# Patient Record
Sex: Female | Born: 1985
Health system: Southern US, Community
[De-identification: ages and names within clinical notes are randomized; demographics above are authoritative.]

## PROBLEM LIST (undated history)

## (undated) DIAGNOSIS — F4321 Adjustment disorder with depressed mood: Secondary | ICD-10-CM

## (undated) DIAGNOSIS — K219 Gastro-esophageal reflux disease without esophagitis: Secondary | ICD-10-CM

## (undated) DIAGNOSIS — E0500 Thyrotoxicosis with diffuse goiter without thyrotoxic crisis or storm: Secondary | ICD-10-CM

## (undated) DIAGNOSIS — S80211A Abrasion, right knee, initial encounter: Secondary | ICD-10-CM

## (undated) DIAGNOSIS — I10 Essential (primary) hypertension: Secondary | ICD-10-CM

## (undated) DIAGNOSIS — E0590 Thyrotoxicosis, unspecified without thyrotoxic crisis or storm: Secondary | ICD-10-CM

## (undated) HISTORY — DX: Gastro-esophageal reflux disease without esophagitis: K21.9

## (undated) HISTORY — DX: Thyrotoxicosis with diffuse goiter without thyrotoxic crisis or storm: E05.00

## (undated) HISTORY — DX: Essential (primary) hypertension: I10

## (undated) HISTORY — DX: Thyrotoxicosis, unspecified without thyrotoxic crisis or storm: E05.90

## (undated) HISTORY — PX: ANTERIOR CRUCIATE LIGAMENT REPAIR: SHX115

## (undated) HISTORY — DX: Adjustment disorder with depressed mood: F43.21

---
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
## 2006-02-22 HISTORY — PX: BREAST SURGERY: SHX581

## 2007-03-18 IMAGING — RF DG FL GUIDE NDL PLMT  - NRPT [facility]
4 series · 4 of 4 positions shown · IV contrast (magnevist)
Comparison: none

CLINICAL DATA: Recurrent knee pain.  History of ACL reconstruction and meniscal repair.  
 FLUOROSCOPICALLY GUIDED NEEDLE PLACEMENT (INJECTION OF THE RIGHT KNEE FOR MR ARTHROGRAPHY):
TECHNIQUE: The procedure was discussed with the patient and her mother.  Informed written consent was obtained.  Lateral patellofemoral joint was localized clinically and fluoroscopically.  1% Lidocaine was administered as local anesthetic after cleansing the skin with Betadine.  A 25 gauge needle was advanced into the patellofemoral joint and approximately 8 cc of a 1% solution of Magnevist mixed with Omnipaque 300 was instilled into the joint.  The needle was removed and spot images were obtained showing contrast throughout the knee joint.

[Series 1: run · 1 of 1 slices shown (1 of 4)]
[im 1/1]
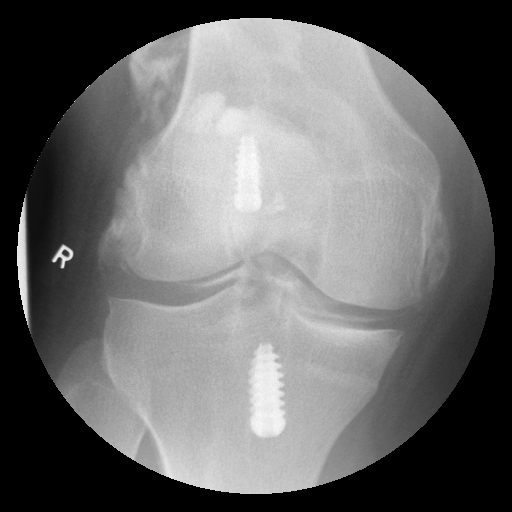

[Series 2: run · 1 of 1 slices shown (2 of 4)]
[im 1/1]
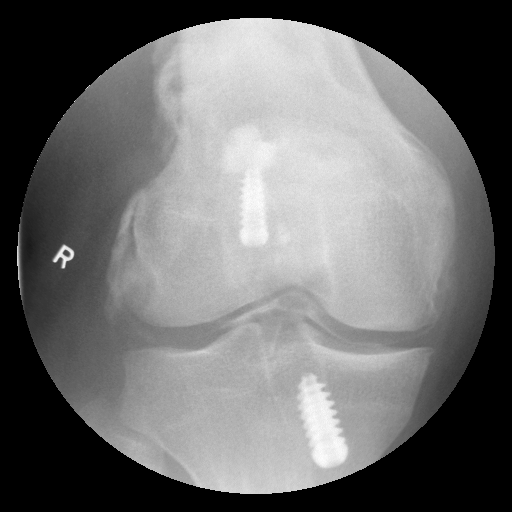

[Series 3: run · 1 of 1 slices shown (3 of 4)]
[im 1/1]
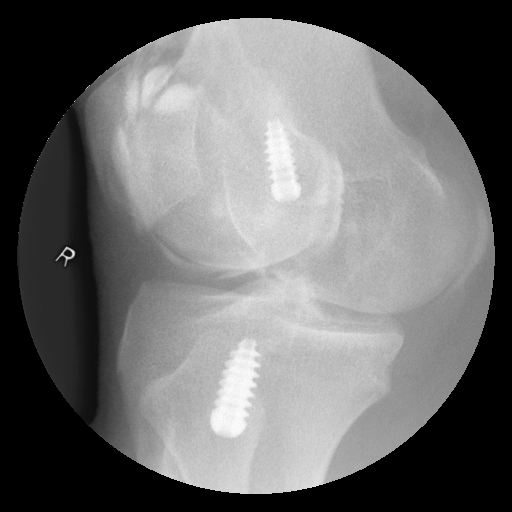

[Series 4: run · 1 of 1 slices shown (4 of 4)]
[im 1/1]
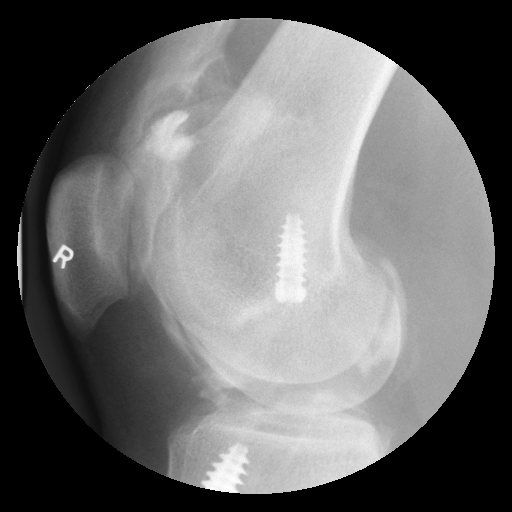

[4 of 4 positions shown; findings below may reference images not displayed]

IMPRESSION: Injection of the right knee for MR arthrography as described.

## 2010-03-15 ENCOUNTER — Encounter: Payer: Self-pay | Admitting: Orthopedic Surgery

## 2016-04-26 ENCOUNTER — Encounter: Payer: Self-pay | Admitting: Family Medicine

## 2016-04-26 ENCOUNTER — Ambulatory Visit (INDEPENDENT_AMBULATORY_CARE_PROVIDER_SITE_OTHER): Payer: Managed Care, Other (non HMO) | Admitting: Family Medicine

## 2016-04-26 VITALS — BP 150/78 | HR 124 | Temp 98.9°F | Resp 16 | Ht 64.0 in | Wt 216.1 lb

## 2016-04-26 DIAGNOSIS — Z7689 Persons encountering health services in other specified circumstances: Secondary | ICD-10-CM | POA: Insufficient documentation

## 2016-04-26 DIAGNOSIS — K219 Gastro-esophageal reflux disease without esophagitis: Secondary | ICD-10-CM

## 2016-04-26 DIAGNOSIS — F4321 Adjustment disorder with depressed mood: Secondary | ICD-10-CM | POA: Diagnosis not present

## 2016-04-26 DIAGNOSIS — Z862 Personal history of diseases of the blood and blood-forming organs and certain disorders involving the immune mechanism: Secondary | ICD-10-CM

## 2016-04-26 DIAGNOSIS — K625 Hemorrhage of anus and rectum: Secondary | ICD-10-CM | POA: Diagnosis not present

## 2016-04-26 HISTORY — DX: Adjustment disorder with depressed mood: F43.21

## 2016-04-26 LAB — TSH: TSH: <0.01 mIU/L — ABNORMAL LOW

## 2016-04-26 LAB — LIPID PANEL
CHOLESTEROL: 109 mg/dL (ref <200)
HDL: 45 mg/dL — ABNORMAL LOW (ref >50)
LDL CALC: 47 mg/dL (ref <100)
Total CHOL/HDL Ratio: 2.4 Ratio (ref <5.0)
Triglycerides: 87 mg/dL (ref <150)
VLDL: 17 mg/dL (ref <30)

## 2016-04-26 LAB — COMPLETE METABOLIC PANEL WITH GFR
ALT: 17 U/L (ref 6–29)
AST: 19 U/L (ref 10–30)
Albumin: 3.8 g/dL (ref 3.6–5.1)
Alkaline Phosphatase: 47 U/L (ref 33–115)
BUN: 7 mg/dL (ref 7–25)
CALCIUM: 9.4 mg/dL (ref 8.6–10.2)
CHLORIDE: 109 mmol/L (ref 98–110)
CO2: 21 mmol/L (ref 20–31)
CREATININE: 0.5 mg/dL (ref 0.50–1.10)
GFR, Est African American: >89 mL/min (ref >=60)
GFR, Est Non African American: >89 mL/min (ref >=60)
Glucose, Bld: 111 mg/dL — ABNORMAL HIGH (ref 65–99)
Potassium: 3.9 mmol/L (ref 3.5–5.3)
Sodium: 141 mmol/L (ref 135–146)
Total Bilirubin: 0.3 mg/dL (ref 0.2–1.2)
Total Protein: 6.5 g/dL (ref 6.1–8.1)

## 2016-04-26 LAB — CBC
HCT: 40 % (ref 35.0–45.0)
Hemoglobin: 13.4 g/dL (ref 11.7–15.5)
MCH: 26.6 pg — ABNORMAL LOW (ref 27.0–33.0)
MCHC: 33.5 g/dL (ref 32.0–36.0)
MCV: 79.4 fL — ABNORMAL LOW (ref 80.0–100.0)
MPV: 10.4 fL (ref 7.5–12.5)
PLATELETS: 270 10*3/uL (ref 140–400)
RBC: 5.04 MIL/uL (ref 3.80–5.10)
RDW: 13.3 % (ref 11.0–15.0)
WBC: 5.9 10*3/uL (ref 3.8–10.8)

## 2016-04-26 MED ORDER — PANTOPRAZOLE SODIUM 40 MG PO TBEC: 40.0000 mg | DELAYED_RELEASE_TABLET | Freq: Every day | ORAL | 3 refills | Status: DC

## 2016-04-26 NOTE — Patient Instructions (Signed)
Need blood testing I will send you a letter with your test results.  If there is anything of concern, we will call right away.  Need to see a GI consultant Referral is placed  Stop the omeprazole Take the pantoprazole instead Take on empty stomach  See me in a month

## 2016-04-26 NOTE — Progress Notes (Signed)
Chief Complaint  Patient presents with  . Establish Care   New to establish Has not had a primary care doctor for years Used to go to pediatrician   She is tearful upset today.  Says it is stress.  Her mother was diagnosed with a progressive auto immune disease Neuromyelitis Optica in August, then found to have breast cancer in November.  She lives at home with parents.  She changed jobs recently.  She has had GI symptoms of abdominal fullness, pain, heartburn, blood in BM and blood in emesis ( each one time)  over the last few months.  May be stress related but paternal grandmother had colon cancer at the age of 30.  She went to the health dept and was given omeprazole.  Not completely helping.   PAP up to date On OCP  No flu shot - advised  Patient Active Problem List   Diagnosis Date Noted  . GERD (gastroesophageal reflux disease) 04/26/2016  . BRBPR (bright red blood per rectum) 04/26/2016  . Encounter to establish care with new doctor 04/26/2016  . Situational depression / anxiety 04/26/2016  . History of iron deficiency anemia 04/26/2016    Outpatient Encounter Prescriptions as of 04/26/2016  Medication Sig  . norethindrone-ethinyl estradiol (MICROGESTIN,JUNEL,LOESTRIN) 1-20 MG-MCG tablet Take 1 tablet by mouth daily.  . [DISCONTINUED] omeprazole (PRILOSEC) 40 MG capsule Take 40 mg by mouth daily.  . pantoprazole (PROTONIX) 40 MG tablet Take 1 tablet (40 mg total) by mouth daily.   No facility-administered encounter medications on file as of 04/26/2016.     Past Medical History:  Diagnosis Date  . GERD (gastroesophageal reflux disease)   . Hypertension   . Situational depression / anxiety 04/26/2016    Past Surgical History:  Procedure Laterality Date  . ANTERIOR CRUCIATE LIGAMENT REPAIR  [id number],2009  . BREAST SURGERY  2008   reconstruction    Social History   Social History  . Marital status: Single    Spouse name: N/A  . Number of children: 0  . Years of  education: 6416   Occupational History  . admin    Social History Main Topics  . Smoking status: Never Smoker  . Smokeless tobacco: Never Used  . Alcohol use No  . Drug use: No  . Sexual activity: Not Currently   Other Topics Concern  . Not on file   Social History Narrative   Graduated [employer] with degree political science      Works as admin asst      Lives with parents      2 dogs          Family History  Problem Relation Age of Onset  . Cancer Mother     breast  . Autoimmune disease Mother     devin  . Cancer Father     skin  . Diabetes Father   . Hypertension Father   . Hyperlipidemia Maternal Grandmother   . Diabetes Maternal Grandfather   . Hyperlipidemia Maternal Grandfather   . Cancer Paternal Grandmother     colon cancer at 7130 and 3060  . ALS Paternal Grandfather [id number]    Review of Systems  Constitutional: Positive for weight loss. Negative for chills, fever and malaise/fatigue.  HENT: Negative for congestion and hearing loss.   Eyes: Negative for blurred vision and double vision.  Respiratory: Negative for cough.   Cardiovascular: Negative for chest pain, palpitations and leg swelling.  Gastrointestinal: Positive for abdominal pain, diarrhea and heartburn.  Genitourinary: Negative for dysuria and hematuria.  Musculoskeletal: Negative for myalgias.  Neurological: Negative for dizziness and headaches.  Psychiatric/Behavioral: Positive for depression. The patient is nervous/anxious and has insomnia.    BP (!) 150/78 (BP Location: Right Arm, Patient Position: Sitting, Cuff Size: Normal)   Pulse (!) 124   Temp 98.9 F (37.2 C) (Temporal)   Resp 16   Ht 5\' 4"  (1.626 m)   Wt 216 lb 1.3 oz (98 kg)   LMP 04/25/2016 (Exact Date)   SpO2 97%   BMI 37.09 kg/m   Physical Exam  Constitutional: She is oriented to person, place, and time. She appears well-developed and well-nourished.  overweight  HENT:  Head: Normocephalic and atraumatic.  Right Ear: External  ear normal.  Left Ear: External ear normal.  Mouth/Throat: Oropharynx is clear and moist.  Eyes: Conjunctivae are normal. Pupils are equal, round, and reactive to light.  Neck: Normal range of motion. Neck supple. No thyromegaly present.  Cardiovascular: Normal rate, regular rhythm and normal heart sounds.   Pulmonary/Chest: Effort normal and breath sounds normal. No respiratory distress.  Abdominal: Soft. Bowel sounds are normal.  No HSM, no mass  Musculoskeletal: Normal range of motion. She exhibits no edema.  Lymphadenopathy:    She has no cervical adenopathy.  Neurological: She is alert and oriented to person, place, and time.  Gait normal  Skin: Skin is warm and dry.  Psychiatric: Her behavior is normal. Thought content normal.  tearful  Nursing note and vitals reviewed.   1. BRBPR (bright red blood per rectum)   2. Gastroesophageal reflux disease, esophagitis presence not specified - Ambulatory referral to Gastroenterology   3. Situational depression / anxiety  - COMPLETE METABOLIC PANEL WITH GFR - Lipid panel - TSH - Urinalysis, Routine w reflex microscopic - CBC  4. History of iron deficiency anemia    Patient Instructions  Need blood testing I will send you a letter with your test results.  If there is anything of concern, we will call right away.  Need to see a GI consultant Referral is placed  Stop the omeprazole Take the pantoprazole instead Take on empty stomach  See me in a month    Eustace Moore, MD

## 2016-04-27 ENCOUNTER — Encounter: Payer: Self-pay | Admitting: Family Medicine

## 2016-04-27 DIAGNOSIS — R7309 Other abnormal glucose: Secondary | ICD-10-CM

## 2016-04-27 DIAGNOSIS — R7989 Other specified abnormal findings of blood chemistry: Secondary | ICD-10-CM

## 2016-04-27 LAB — URINALYSIS, ROUTINE W REFLEX MICROSCOPIC
Bilirubin Urine: NEGATIVE
Glucose, UA: NEGATIVE
KETONES UR: NEGATIVE
Leukocytes, UA: NEGATIVE
NITRITE: NEGATIVE
PROTEIN: NEGATIVE
Specific Gravity, Urine: 1.031 (ref 1.001–1.035)
pH: 5.5 (ref 5.0–8.0)

## 2016-04-27 LAB — URINALYSIS, MICROSCOPIC ONLY
BACTERIA UA: NONE SEEN [HPF]
Casts: NONE SEEN [LPF]
YEAST: NONE SEEN [HPF]

## 2016-04-29 ENCOUNTER — Encounter: Payer: Self-pay | Admitting: Internal Medicine

## 2016-05-20 ENCOUNTER — Ambulatory Visit (INDEPENDENT_AMBULATORY_CARE_PROVIDER_SITE_OTHER): Payer: Managed Care, Other (non HMO) | Admitting: Nurse Practitioner

## 2016-05-20 ENCOUNTER — Other Ambulatory Visit: Payer: Self-pay

## 2016-05-20 ENCOUNTER — Encounter: Payer: Self-pay | Admitting: Nurse Practitioner

## 2016-05-20 VITALS — BP 155/85 | HR 135 | Temp 97.9°F | Ht 66.0 in | Wt 211.4 lb

## 2016-05-20 DIAGNOSIS — R634 Abnormal weight loss: Secondary | ICD-10-CM

## 2016-05-20 DIAGNOSIS — K219 Gastro-esophageal reflux disease without esophagitis: Secondary | ICD-10-CM

## 2016-05-20 DIAGNOSIS — R194 Change in bowel habit: Secondary | ICD-10-CM

## 2016-05-20 DIAGNOSIS — K625 Hemorrhage of anus and rectum: Secondary | ICD-10-CM | POA: Diagnosis not present

## 2016-05-20 MED ORDER — PEG-KCL-NACL-NASULF-NA ASC-C 100 G PO SOLR: 1.0000 | ORAL | 0 refills | Status: DC

## 2016-05-20 NOTE — Patient Instructions (Signed)
1. We will schedule your procedure for you. 2. Bring her stool studies to the lab, not our office, and we will call you with the results when they are available. 3. Keep taking Protonix. 4. Return for follow-up in 2 months. Call us if you have any severe or worsening symptoms before then.

## 2016-05-20 NOTE — Progress Notes (Addendum)
REVIEWED-NO ADDITIONAL RECOMMENDATIONS.  Primary Care Physician:  Eustace MooreYvonne Sue Nelson, MD Primary Gastroenterologist:  Dr. Darrick PennaFields  Chief Complaint  Patient presents with  . Rectal Bleeding    bright red x 1  . Gastroesophageal Reflux  . Emesis    had blood in vomit x 1  . Weight Loss    lost 5 lbs since 04/26/16, no change in eating    HPI:   Kelly [name] is a 31 y.o. female who presents for rectal bleeding and reflux. The patient was last seen by primary care 04/26/2016 and at that point was under significant stress due to her mother recently being diagnosed with progressive autoimmune disease and breast cancer. She complained of abdominal fullness, pain, heartburn, blood in her bowel movements and emesis over the last few months. Paternal grandmother had colon cancer at age of 30. Had started omeprazole but not helping.  Today she states she's doing pretty good. Stress has not decreased at all. Protonix is helping her GERD symptoms. Is having more breakout (acne) since starting it although ADE profile doesn't include this for Protonix. No breakthrough heartburn on Protonix. Has hematochezia in January, bright red, on the stool. No hemorrhoid symptoms. Not constipated at the time. Denies melena, other abdominal pain (other than her reflux symptoms). Had an episode of vomiting about 2 weeks after hematochezia with noted red specks (has a picture, 2-3 red specks noted). No further vomiting since then. Is also having frequent diarrhea 1-2 times a day. States she has always had some diarrhea, typically around her cycle, but now has been more persistent, constant, and ongoing. This change occurred in November 2017 when her mom was diagnosed with above conditions (significant increase in stress.) Has also had about 5-10 lb weight loss in the past 2 months ([facility]). No significant change in appetite. Denies chest pain, dyspnea, dizziness, lightheadedness,  syncope, near syncope. Denies any other upper or lower GI symptoms.  Past Medical History:  Diagnosis Date  . GERD (gastroesophageal reflux disease)   . Hypertension   . Situational depression / anxiety 04/26/2016    Past Surgical History:  Procedure Laterality Date  . ANTERIOR CRUCIATE LIGAMENT REPAIR  [id number],2009  . BREAST SURGERY  2008   reconstruction    Current Outpatient Prescriptions  Medication Sig Dispense Refill  . norethindrone-ethinyl estradiol (MICROGESTIN,JUNEL,LOESTRIN) 1-20 MG-MCG tablet Take 1 tablet by mouth daily.    . pantoprazole (PROTONIX) 40 MG tablet Take 1 tablet (40 mg total) by mouth daily. 30 tablet 3   No current facility-administered medications for this visit.     Allergies as of 05/20/2016  . (No Known Allergies)    Family History  Problem Relation Age of Onset  . Cancer Mother     breast  . Autoimmune disease Mother     devin  . Cancer Father     skin  . Diabetes Father   . Hypertension Father   . Hyperlipidemia Maternal Grandmother   . Diabetes Maternal Grandfather   . Hyperlipidemia Maternal Grandfather   . Colon cancer Paternal Grandmother     colon cancer at 230 and 1260  . ALS Paternal Grandfather [id number]    Social History   Social History  . Marital status: Single    Spouse name: N/A  . Number of children: 0  . Years of education: 3516   Occupational History  . admin    Social History Main Topics  . Smoking status: Never Smoker  .  Smokeless tobacco: Never Used  . Alcohol use No  . Drug use: No  . Sexual activity: Not Currently   Other Topics Concern  . Not on file   Social History Narrative   Graduated [employer] with degree political science      Works as admin asst      Lives with parents      2 dogs          Review of Systems: Complete ROS negative except as per HPI.   Physical Exam: BP (!) 155/85   Pulse (!) 135   Temp 97.9 F (36.6 C) (Oral)   Ht 5\' 6"  (1.676 m)   Wt 211 lb 6.4 oz (95.9 kg)   LMP  04/25/2016 (Exact Date)   BMI 34.12 kg/m  General:   Obese female. Alert and oriented. Pleasant and cooperative. Well-nourished and well-developed.  Head:  Normocephalic and atraumatic. Eyes:  Without icterus, sclera clear and conjunctiva pink.  Ears:  Normal auditory acuity. Cardiovascular:  S1, S2 present without murmurs appreciated. Extremities without clubbing or edema. Respiratory:  Clear to auscultation bilaterally. No wheezes, rales, or rhonchi. No distress.  Gastrointestinal:  +BS, soft, non-tender and non-distended. No HSM noted. No guarding or rebound. No masses appreciated.  Rectal:  Deferred  Musculoskalatal:  Symmetrical without gross deformities. Neurologic:  Alert and oriented x4;  grossly normal neurologically. Psych:  Alert and cooperative. Normal mood and affect. Heme/Lymph/Immune: No excessive bruising noted.    05/20/2016 10:39 AM   Disclaimer: This note was dictated with voice recognition software. Similar sounding words can inadvertently be transcribed and may not be corrected upon review.

## 2016-05-20 NOTE — Assessment & Plan Note (Signed)
The patient is indicated about a 5-10 pound weight loss over the past 2 months. This is unintentional and without significant change in appetite. Given this as well as her episode of rectal bleeding and family history of colon cancer and a secondary relative (paternal grandmother) we will proceed with a colonoscopy as per below. Return for follow-up in 2 months.

## 2016-05-20 NOTE — Assessment & Plan Note (Signed)
The patient has had a single episode of bright red blood on the stool but she is noted. She does not have hemorrhoids or hemorrhoid symptoms. Additionally, she has had a change in stool habits and weight loss as per above. Her paternal grandmother had colon cancer at age 30. Her father has not had a colonoscopy yet. Given her constellation of symptoms of feel it is warranted to proceed with a diagnostic colonoscopy at this time. Return for follow-up in 2 months.  Proceed with colonoscopy with Dr. Darrick PennaFields in the near future. The risks, benefits, and alternatives have been discussed in detail with the patient. They state understanding and desire to proceed.   The patient is not on any anticoagulants, anxiolytics, chronic pain medications, or antidepressants. Conscious sedation should be adequate for her procedure.

## 2016-05-20 NOTE — Assessment & Plan Note (Signed)
The patient notes a change in her stool habits. She has always had diarrhea but generally this is only been around her menstrual cycle. Within the past couple few months she has switched over to persistent diarrhea. This is in addition to weight loss and rectal bleeding. Her paternal grandmother had colon cancer at age 30. Her father has not had a colonoscopy as of yet. We'll plan for colonoscopy as per below. I will also check stool studies to make sure she does not have a colonic infection. Return for follow-up in 2 months.

## 2016-05-20 NOTE — Assessment & Plan Note (Signed)
Well controlled on protonix. Continue PPI, return for follow-up in 2 months.

## 2016-05-24 NOTE — Progress Notes (Signed)
CC'D TO PCP °

## 2016-05-25 ENCOUNTER — Ambulatory Visit: Payer: Managed Care, Other (non HMO) | Admitting: Family Medicine

## 2016-05-26 ENCOUNTER — Other Ambulatory Visit: Payer: Self-pay | Admitting: Nurse Practitioner

## 2016-05-27 ENCOUNTER — Ambulatory Visit: Payer: Managed Care, Other (non HMO) | Admitting: Family Medicine

## 2016-05-27 LAB — CLOSTRIDIUM DIFFICILE BY PCR: Toxigenic C. Difficile by PCR: NOT DETECTED

## 2016-05-27 NOTE — Progress Notes (Signed)
PT is aware.

## 2016-05-31 LAB — GASTROINTESTINAL PATHOGEN PANEL PCR

## 2016-06-01 ENCOUNTER — Encounter: Payer: Self-pay | Admitting: Family Medicine

## 2016-06-01 ENCOUNTER — Ambulatory Visit (INDEPENDENT_AMBULATORY_CARE_PROVIDER_SITE_OTHER): Payer: Managed Care, Other (non HMO) | Admitting: Family Medicine

## 2016-06-01 VITALS — BP 130/72 | HR 104 | Temp 98.5°F | Resp 18 | Ht 66.0 in | Wt 210.0 lb

## 2016-06-01 DIAGNOSIS — R7989 Other specified abnormal findings of blood chemistry: Secondary | ICD-10-CM

## 2016-06-01 DIAGNOSIS — R000 Tachycardia, unspecified: Secondary | ICD-10-CM | POA: Diagnosis not present

## 2016-06-01 DIAGNOSIS — R634 Abnormal weight loss: Secondary | ICD-10-CM | POA: Diagnosis not present

## 2016-06-01 DIAGNOSIS — R946 Abnormal results of thyroid function studies: Secondary | ICD-10-CM

## 2016-06-01 DIAGNOSIS — R194 Change in bowel habit: Secondary | ICD-10-CM

## 2016-06-01 NOTE — Progress Notes (Signed)
Chief Complaint  Patient presents with  . Follow-up    1 month   Patient is here for routine follow-up. She has been seen by gastroenterology and has a colonoscopy scheduled. Her stool studies so far have not revealed a pathogen causing diarrhea. Her GERD symptoms are reasonably well controlled on the pantoprazole once a day. She continues to slowly lose weight. She has not had any more blood in her bowels. I did review the blood work that she had at her last visit. Her TSH is suppressed. We discussed that she possibly has hyperthyroidism causing the majority of her symptoms including diarrhea, weight loss, tachycardia, and anxiety. I am repeating her thyroid studies and thyroid antibodies today. She is otherwise well with no complaints.  Patient Active Problem List   Diagnosis Date Noted  . Change in stool habits 05/20/2016  . Loss of weight 05/20/2016  . GERD (gastroesophageal reflux disease) 04/26/2016  . BRBPR (bright red blood per rectum) 04/26/2016  . Encounter to establish care with new doctor 04/26/2016  . Situational depression / anxiety 04/26/2016  . History of iron deficiency anemia 04/26/2016    Outpatient Encounter Prescriptions as of 06/01/2016  Medication Sig  . Multiple Vitamin (MULTIVITAMIN WITH MINERALS) TABS tablet Take 1 tablet by mouth once a week.  . naproxen sodium (ANAPROX) 220 MG tablet Take 440 mg by mouth daily as needed (pain).  . norethindrone-ethinyl estradiol (MICROGESTIN,JUNEL,LOESTRIN) 1-20 MG-MCG tablet Take 1 tablet by mouth daily.  . pantoprazole (PROTONIX) 40 MG tablet Take 1 tablet (40 mg total) by mouth daily.  . peg 3350 powder (MOVIPREP) 100 g SOLR Take 1 kit (200 g total) by mouth as directed.   No facility-administered encounter medications on file as of 06/01/2016.     No Known Allergies  Review of Systems  Constitutional: Positive for unexpected weight change. Negative for activity change and appetite change.  HENT: Negative for  congestion, dental problem, postnasal drip and rhinorrhea.   Eyes: Negative for redness and visual disturbance.  Respiratory: Negative for cough and shortness of breath.   Cardiovascular: Negative for chest pain, palpitations and leg swelling.       Rapid heart rate  Gastrointestinal: Positive for diarrhea. Negative for abdominal pain and constipation.  Genitourinary: Negative for difficulty urinating, frequency and menstrual problem.  Musculoskeletal: Negative for arthralgias and back pain.  Neurological: Negative for dizziness and headaches.  Psychiatric/Behavioral: Negative for dysphoric mood and sleep disturbance. The patient is nervous/anxious.     BP 130/72 (BP Location: Right Arm, Patient Position: Sitting, Cuff Size: Normal)   Pulse (!) 104   Temp 98.5 F (36.9 C) (Temporal)   Resp 18   Ht '5\' 6"'$  (1.676 m)   Wt 210 lb (95.3 kg)   LMP 05/25/2016 (Exact Date)   SpO2 99%   BMI 33.89 kg/m   Physical Exam  Constitutional: She is oriented to person, place, and time. She appears well-developed and well-nourished. No distress.  Normal mood and affect  HENT:  Head: Normocephalic and atraumatic.  Mouth/Throat: Oropharynx is clear and moist.  Eyes: Conjunctivae are normal. Pupils are equal, round, and reactive to light.  Neck: Normal range of motion. Neck supple. Thyromegaly present.  Slight diffuse thyromegaly  Cardiovascular: Regular rhythm and normal heart sounds.   Rate 116  Pulmonary/Chest: Effort normal and breath sounds normal.  Neurological: She is alert and oriented to person, place, and time. She displays normal reflexes.  Reflexes are normal  Skin:  Skin and hair are  normal  Psychiatric: She has a normal mood and affect. Her behavior is normal.    ASSESSMENT/PLAN:  1. Abnormal thyroid stimulating hormone (TSH) level Suspect autoimmune thyroid disease/Graves' disease - TSH - T3 - T4 - Thyroid peroxidase antibody  2. Loss of weight Suspect  hyperthyroidism  3. Change in stool habits As above  4. Tachycardia As above   Patient Instructions  Reduce the caffeine I have ordered additional lab tests I will notify you test results.  If there is anything of concern, we will call right away. May need referral to endocrinology  Call for problems - see as needed follow up    Raylene Everts, MD

## 2016-06-01 NOTE — Patient Instructions (Signed)
Reduce the caffeine I have ordered additional lab tests I will notify you test results.  If there is anything of concern, we will call right away. May need referral to endocrinology  Call for problems - see as needed follow up

## 2016-06-02 ENCOUNTER — Encounter: Payer: Self-pay | Admitting: Family Medicine

## 2016-06-02 DIAGNOSIS — E0590 Thyrotoxicosis, unspecified without thyrotoxic crisis or storm: Secondary | ICD-10-CM

## 2016-06-02 LAB — HEMOGLOBIN A1C
HEMOGLOBIN A1C: 4.9 % (ref <5.7)
MEAN PLASMA GLUCOSE: 94 mg/dL

## 2016-06-02 LAB — T4: T4 TOTAL: 30.3 ug/dL — AB (ref 4.5–12.0)

## 2016-06-02 LAB — T3: T3 TOTAL: 591 ng/dL — AB (ref 76–181)

## 2016-06-02 LAB — THYROID PEROXIDASE ANTIBODY: Thyroperoxidase Ab SerPl-aCnc: 125 IU/mL — ABNORMAL HIGH (ref <9)

## 2016-06-02 LAB — TSH: TSH: 0.01 mIU/L — ABNORMAL LOW

## 2016-06-04 ENCOUNTER — Ambulatory Visit (HOSPITAL_COMMUNITY)
Admission: RE | Admit: 2016-06-04 | Discharge: 2016-06-04 | Disposition: A | Discharge status: Home / Self Care | Payer: Managed Care, Other (non HMO) | Source: Ambulatory Visit | Attending: Gastroenterology | Admitting: Gastroenterology

## 2016-06-04 ENCOUNTER — Encounter (HOSPITAL_COMMUNITY): Payer: Self-pay | Admitting: *Deleted

## 2016-06-04 ENCOUNTER — Encounter (HOSPITAL_COMMUNITY)
Admission: RE | Disposition: A | Discharge status: Home / Self Care | Payer: Self-pay | Source: Ambulatory Visit | Attending: Gastroenterology

## 2016-06-04 DIAGNOSIS — Z832 Family history of diseases of the blood and blood-forming organs and certain disorders involving the immune mechanism: Secondary | ICD-10-CM | POA: Diagnosis not present

## 2016-06-04 DIAGNOSIS — Q438 Other specified congenital malformations of intestine: Secondary | ICD-10-CM | POA: Insufficient documentation

## 2016-06-04 DIAGNOSIS — Z79899 Other long term (current) drug therapy: Secondary | ICD-10-CM | POA: Diagnosis not present

## 2016-06-04 DIAGNOSIS — Z808 Family history of malignant neoplasm of other organs or systems: Secondary | ICD-10-CM | POA: Diagnosis not present

## 2016-06-04 DIAGNOSIS — Z803 Family history of malignant neoplasm of breast: Secondary | ICD-10-CM | POA: Diagnosis not present

## 2016-06-04 DIAGNOSIS — R1030 Lower abdominal pain, unspecified: Secondary | ICD-10-CM | POA: Diagnosis not present

## 2016-06-04 DIAGNOSIS — F419 Anxiety disorder, unspecified: Secondary | ICD-10-CM | POA: Insufficient documentation

## 2016-06-04 DIAGNOSIS — R634 Abnormal weight loss: Secondary | ICD-10-CM

## 2016-06-04 DIAGNOSIS — X58XXXA Exposure to other specified factors, initial encounter: Secondary | ICD-10-CM | POA: Diagnosis not present

## 2016-06-04 DIAGNOSIS — Z820 Family history of epilepsy and other diseases of the nervous system: Secondary | ICD-10-CM | POA: Insufficient documentation

## 2016-06-04 DIAGNOSIS — I10 Essential (primary) hypertension: Secondary | ICD-10-CM | POA: Diagnosis not present

## 2016-06-04 DIAGNOSIS — Z8249 Family history of ischemic heart disease and other diseases of the circulatory system: Secondary | ICD-10-CM | POA: Insufficient documentation

## 2016-06-04 DIAGNOSIS — K648 Other hemorrhoids: Secondary | ICD-10-CM | POA: Insufficient documentation

## 2016-06-04 DIAGNOSIS — S80211A Abrasion, right knee, initial encounter: Secondary | ICD-10-CM | POA: Diagnosis not present

## 2016-06-04 DIAGNOSIS — K921 Melena: Secondary | ICD-10-CM | POA: Insufficient documentation

## 2016-06-04 DIAGNOSIS — K219 Gastro-esophageal reflux disease without esophagitis: Secondary | ICD-10-CM | POA: Insufficient documentation

## 2016-06-04 DIAGNOSIS — R194 Change in bowel habit: Secondary | ICD-10-CM | POA: Diagnosis not present

## 2016-06-04 DIAGNOSIS — Z800 Family history of malignant neoplasm of digestive organs: Secondary | ICD-10-CM | POA: Diagnosis not present

## 2016-06-04 DIAGNOSIS — Z833 Family history of diabetes mellitus: Secondary | ICD-10-CM | POA: Insufficient documentation

## 2016-06-04 HISTORY — PX: COLONOSCOPY: SHX5424

## 2016-06-04 HISTORY — DX: Abrasion, right knee, initial encounter: S80.211A

## 2016-06-04 SURGERY — COLONOSCOPY
Anesthesia: Moderate Sedation

## 2016-06-04 MED ORDER — PROMETHAZINE HCL 25 MG/ML IJ SOLN
12.5000 mg | Freq: Once | INTRAMUSCULAR | Status: AC
  Administered 2016-06-04: 12.5 mg via INTRAVENOUS

## 2016-06-04 MED ORDER — MEPERIDINE HCL 100 MG/ML IJ SOLN
INTRAMUSCULAR | Status: AC
  Filled 2016-06-04: qty 2

## 2016-06-04 MED ORDER — SODIUM CHLORIDE 0.9% FLUSH
INTRAVENOUS | Status: AC
  Filled 2016-06-04: qty 10

## 2016-06-04 MED ORDER — STERILE WATER FOR IRRIGATION IR SOLN
Status: DC | PRN
  Administered 2016-06-04: 2.5 mL

## 2016-06-04 MED ORDER — SODIUM CHLORIDE 0.9 % IV SOLN
INTRAVENOUS | Status: DC
  Administered 2016-06-04: 12:00:00 via INTRAVENOUS

## 2016-06-04 MED ORDER — MEPERIDINE HCL 100 MG/ML IJ SOLN
INTRAMUSCULAR | Status: DC | PRN
  Administered 2016-06-04: 50 mg via INTRAVENOUS
  Administered 2016-06-04 (×2): 25 mg via INTRAVENOUS

## 2016-06-04 MED ORDER — PROMETHAZINE HCL 25 MG/ML IJ SOLN
INTRAMUSCULAR | Status: AC
  Filled 2016-06-04: qty 1

## 2016-06-04 MED ORDER — MIDAZOLAM HCL 5 MG/5ML IJ SOLN
INTRAMUSCULAR | Status: AC
  Filled 2016-06-04: qty 10

## 2016-06-04 MED ORDER — MIDAZOLAM HCL 5 MG/5ML IJ SOLN
INTRAMUSCULAR | Status: DC | PRN
Start: 2016-06-04 — End: 2016-06-04
  Administered 2016-06-04 (×3): 2 mg via INTRAVENOUS
  Administered 2016-06-04: 1 mg via INTRAVENOUS
  Administered 2016-06-04: 2 mg via INTRAVENOUS

## 2016-06-04 MED ORDER — ELUXADOLINE 75 MG PO TABS: 75.0000 mg | ORAL_TABLET | Freq: Two times a day (BID) | ORAL | 5 refills | Status: DC

## 2016-06-04 NOTE — Op Note (Signed)
[facility] Patient Name: Kelly [name] Procedure Date: 06/04/2016 12:59 PM MRN: [id number] Date of Birth: [birth year] Attending MD: Jonette Eva , MD CSN: [id number] Age: 31 Admit Type: Outpatient Procedure:                Colonoscopy WITH COLD FORCEPS BIOPSY Indications:              Lower abdominal pain, Hematochezia-HAS CHANGE IN                            HER STOOL BEFORE AND AFTER HER MENSTRUAL CYCLE.                            OTHERWISE SHE USUALLY HAS NORMAL STOOL. Providers:                Jonette Eva, MD, Nena Polio, RN, Toniann Fail                            RN, RN Referring MD:             Lily Peer Medicines:                Midazolam 9 mg IV, Meperidine 100 mg IV,                            Promethazine 12.5 mg IV Complications:            No immediate complications. Estimated Blood Loss:     Estimated blood loss was minimal. Procedure:                Pre-Anesthesia Assessment:                           - Prior to the procedure, a History and Physical                            was performed, and patient medications and                            allergies were reviewed. The patient's tolerance of                            previous anesthesia was also reviewed. The risks                            and benefits of the procedure and the sedation                            options and risks were discussed with the patient.                            All questions were answered, and informed consent                            was obtained. Prior Anticoagulants: The patient has  taken naproxen, last dose was 1 day prior to                            procedure. ASA Grade Assessment: II - A patient                            with mild systemic disease. After reviewing the                            risks and benefits, the patient was deemed in                            satisfactory condition to undergo the procedure.       After obtaining informed consent, the colonoscope                            was passed under direct vision. Throughout the                            procedure, the patient's blood pressure, pulse, and                            oxygen saturations were monitored continuously. The                            EC-[id number] ([id number]) scope was introduced through                            the anus and advanced to the 10 cm into the ileum.                            The colonoscopy was technically difficult and                            complex due to significant looping. Successful                            completion of the procedure was aided by increasing                            the dose of sedation medication, straightening and                            shortening the scope to obtain bowel loop reduction                            and COLOWRAP. The patient tolerated the procedure                            fairly well. SHE WAS EXTREMELY AGITATED AND TEARFUL                            WHEN WITHDRAWING THE SCOPE.  SHE WAS RELIEVED BY                            RELEASING THE COLOWRAP. AT THE END OF THE PROCEDURE                            SHE WAS ASLEEP. The quality of the bowel                            preparation was excellent. The terminal ileum,                            ileocecal valve, appendiceal orifice, and rectum                            were photographed. Scope In: 1:51:54 PM Scope Out: 2:02:06 PM Scope Withdrawal Time: 0 hours 8 minutes 10 seconds  Total Procedure Duration: 0 hours 10 minutes 12 seconds  Findings:      The terminal ileum appeared normal.      The recto-sigmoid colon, sigmoid colon and descending colon were       moderately redundant. Biopsies for histology were taken with a cold       forceps from the cecum, ascending colon and transverse colon for       evaluation of microscopic colitis.      The exam was otherwise without abnormality.      Internal  hemorrhoids were found during retroflexion. The hemorrhoids       were small. Impression:               - ABDOMINAL PAIN AND LOOSE STOOL MOST LIKELY DUE TO                            IBS-D, LESS LIKELY MICROSCOPIC COLITIS                           - Redundant LEFT colon.                           - RECTAL BLEEDING DUE TO Internal hemorrhoids. Moderate Sedation:      Moderate (conscious) sedation was administered by the endoscopy nurse       and supervised by the endoscopist. The following parameters were       monitored: oxygen saturation, heart rate, blood pressure, and response       to care. Total physician intraservice time was 37 minutes. Recommendation:           - High fiber/LACTOSE FREE diet.                           - Continue present medications. ADD VIBERZI WITH                            FLARES.                           - Await pathology results.                           -  Repeat colonoscopy AGE 89 FOR SURVEILLANCE.                           - Return to GI office in 4 months.                           - Patient has a contact number available for                            emergencies. The signs and symptoms of potential                            delayed complications were discussed with the                            patient. Return to normal activities tomorrow.                            Written discharge instructions were provided to the                            patient. Procedure Code(s):        --- Professional ---                           [phone], Colonoscopy, flexible; with biopsy, single                            or multiple                           99152, Moderate sedation services provided by the                            same physician or other qualified health care                            professional performing the diagnostic or                            therapeutic service that the sedation supports,                            requiring the presence  of an independent trained                            observer to assist in the monitoring of the                            patient's level of consciousness and physiological                            status; initial 15 minutes of intraservice time,  patient age 33 years or older                           [phone], Moderate sedation services; each additional                            15 minutes intraservice time Diagnosis Code(s):        --- Professional ---                           K64.8, Other hemorrhoids                           R10.30, Lower abdominal pain, unspecified                           K92.1, Melena (includes Hematochezia)                           Q43.8, Other specified congenital malformations of                            intestine CPT copyright 2016 [employer]. All rights reserved. The codes documented in this report are preliminary and upon coder review may  be revised to meet current compliance requirements. Jonette Eva, MD Jonette Eva, MD 06/04/2016 2:34:28 PM This report has been signed electronically. Number of Addenda: 0

## 2016-06-04 NOTE — Discharge Instructions (Signed)
NO SOURCE FOR YOUR ABDOMINAL PAIN/DIARRHEA WAS IDENTIFIED. IT IS MOST LIKELY DUE TO IBS and less likely thyroid disease. You have internal hemorrhoids, WHICH IS THE CAUSE FOR YOUR RECTAL BLEEDING. YOU DID NOT HAVE ANY POLYPS. THE LAST PART OF YOUR SMALL BOWEL IS NORMAL. I BIOPSIED YOUR COLON.   TO REDUCE ABDOMINAL PAIN AND WATERY STOOL,  TRY VIBERZI. TAKE ONE TABLET DAILY OR EVERY OTHER DAY  WHEN YOU ARE HAVING FLARES. HOLD FOR IF YOU DON'T HAVE A BM IN A 24 HR PERIOD OF TIME. USE ONLY IMODIUM 1 OR 2 TIMES A DAY IF NEEDED TO CONTROL LOOSE STOOLS. YOU MUST CALL IF YOU GO MORE THAN 4 DAYS WITHOUT A BOWEL MOVEMENT OR YOU DEVELOP INCREASE IN NAUSEA, VOMITING, OR ABDOMINAL PAIN.  DO NOT CONSUME MORE THAN 3 ALCOHOLIC DRINKS A DAY WHILE TAKING VIBERZI.   DRINK WATER TO KEEP YOUR URINE LIGHT YELLOW.  FOLLOW A HIGH FIBER/LACTOSE FREE DIET. AVOID ITEMS THAT CAUSE BLOATING. SEE INFO BELOW.  USE PREPARATION H FOUR TIMES  A DAY IF NEEDED TO RELIEVE RECTAL PAIN/PRESSURE/BLEEDING.  YOUR BIOPSY RESULTS WILL BE AVAILABLE IN MY CHART AFTER APR 17 AND MY OFFICE WILL CONTACT YOU IN 10-14 DAYS WITH YOUR RESULTS.   FOLLOW UP IN 4 MOS.  Colonoscopy Care After Read the instructions outlined below and refer to this sheet in the next week. These discharge instructions provide you with general information on caring for yourself after you leave the hospital. While your treatment has been planned according to the most current medical practices available, unavoidable complications occasionally occur. If you have any problems or questions after discharge, call DR. Mykenzi Vanzile, [phone].  ACTIVITY  You may resume your regular activity, but move at a slower pace for the next 24 hours.   Take frequent rest periods for the next 24 hours.   Walking will help get rid of the air and reduce the bloated feeling in your belly (abdomen).   No driving for 24 hours (because of the medicine (anesthesia) used during the test).    You may shower.   Do not sign any important legal documents or operate any machinery for 24 hours (because of the anesthesia used during the test).    NUTRITION  Drink plenty of fluids.   You may resume your normal diet as instructed by your doctor.   Begin with a light meal and progress to your normal diet. Heavy or fried foods are harder to digest and may make you feel sick to your stomach (nauseated).   Avoid alcoholic beverages for 24 hours or as instructed.    MEDICATIONS  You may resume your normal medications.   WHAT YOU CAN EXPECT TODAY  Some feelings of bloating in the abdomen.   Passage of more gas than usual.   Spotting of blood in your stool or on the toilet paper  .  IF YOU HAD POLYPS REMOVED DURING THE COLONOSCOPY:  Eat a soft diet IF YOU HAVE NAUSEA, BLOATING, ABDOMINAL PAIN, OR VOMITING.    FINDING OUT THE RESULTS OF YOUR TEST Not all test results are available during your visit. DR. Darrick Penna WILL CALL YOU WITHIN 14 DAYS OF YOUR PROCEDUE WITH YOUR RESULTS. Do not assume everything is normal if you have not heard from DR. Bianca Vester, CALL HER OFFICE AT [phone].  SEEK IMMEDIATE MEDICAL ATTENTION AND CALL THE OFFICE: [phone] IF:  You have more than a spotting of blood in your stool.   Your belly is swollen (abdominal distention).  You are nauseated or vomiting.   You have a temperature over 101F.   You have abdominal pain or discomfort that is severe or gets worse throughout the day.  High-Fiber Diet A high-fiber diet changes your normal diet to include more whole grains, legumes, fruits, and vegetables. Changes in the diet involve replacing refined carbohydrates with unrefined foods. The calorie level of the diet is essentially unchanged. The Dietary Reference Intake (recommended amount) for adult males is 38 grams per day. For adult females, it is 25 grams per day. Pregnant and lactating women should consume 28 grams of fiber per day. Fiber  is the intact part of a plant that is not broken down during digestion. Functional fiber is fiber that has been isolated from the plant to provide a beneficial effect in the body. PURPOSE  Increase stool bulk.   Ease and regulate bowel movements.   Lower cholesterol.   REDUCE RISK OF COLON CANCER  INDICATIONS THAT YOU NEED MORE FIBER  Constipation and hemorrhoids.   Uncomplicated diverticulosis (intestine condition) and irritable bowel syndrome.   Weight management.   As a protective measure against hardening of the arteries (atherosclerosis), diabetes, and cancer.   GUIDELINES FOR INCREASING FIBER IN THE DIET  Start adding fiber to the diet slowly. A gradual increase of about 5 more grams (2 slices of whole-wheat bread, 2 servings of most fruits or vegetables, or 1 bowl of high-fiber cereal) per day is best. Too rapid an increase in fiber may result in constipation, flatulence, and bloating.   Drink enough water and fluids to keep your urine clear or pale yellow. Water, juice, or caffeine-free drinks are recommended. Not drinking enough fluid may cause constipation.   Eat a variety of high-fiber foods rather than one type of fiber.   Try to increase your intake of fiber through using high-fiber foods rather than fiber pills or supplements that contain small amounts of fiber.   The goal is to change the types of food eaten. Do not supplement your present diet with high-fiber foods, but replace foods in your present diet.   INCLUDE A VARIETY OF FIBER SOURCES  Replace refined and processed grains with whole grains, canned fruits with fresh fruits, and incorporate other fiber sources. White rice, white breads, and most bakery goods contain little or no fiber.   Brown whole-grain rice, buckwheat oats, and many fruits and vegetables are all good sources of fiber. These include: broccoli, Brussels sprouts, cabbage, cauliflower, beets, sweet potatoes, white potatoes (skin on), carrots,  tomatoes, eggplant, squash, berries, fresh fruits, and dried fruits.   Cereals appear to be the richest source of fiber. Cereal fiber is found in whole grains and bran. Bran is the fiber-rich outer coat of cereal grain, which is largely removed in refining. In whole-grain cereals, the bran remains. In breakfast cereals, the largest amount of fiber is found in those with "bran" in their names. The fiber content is sometimes indicated on the label.   You may need to include additional fruits and vegetables each day.   In baking, for 1 cup white flour, you may use the following substitutions:   1 cup whole-wheat flour minus 2 tablespoons.   1/2 cup white flour plus 1/2 cup whole-wheat flour.     Lactose Free Diet Lactose is a carbohydrate that is found mainly in milk and milk products, as well as in foods with added milk or whey. Lactose must be digested by the enzyme in order to be used by the  body. Lactose intolerance occurs when there is a shortage of lactase. When your body is not able to digest lactose, you may feel sick to your stomach (nausea), bloating, cramping, gas and diarrhea.  There are many dairy products that may be tolerated better than milk by some people:  The use of cultured dairy products such as yogurt, buttermilk, cottage cheese, and sweet acidophilus milk (Kefir) for lactase-deficient individuals is usually well tolerated. This is because the healthy bacteria help digest lactose.   Lactose-hydrolyzed milk (Lactaid) contains 40-90% less lactose than milk and may also be well tolerated.   SPECIAL NOTES  Lactose is a carbohydrates. The major food source is dairy products. Reading food labels is important. Many products contain lactose even when they are not made from milk. Look for the following words: whey, milk solids, dry milk solids, nonfat dry milk powder. Typical sources of lactose other than dairy products include breads, candies, cold cuts, prepared and processed  foods, and commercial sauces and gravies.   All foods must be prepared without milk, cream, or other dairy foods.   Soy milk and lactose-free supplements (LACTASE) may be used as an alternative to milk.   FOOD GROUP ALLOWED/RECOMMENDED AVOID/USE SPARINGLY  BREADS / STARCHES 4 servings or more* Breads and rolls made without milk. [place], [place], or [place] bread. Breads and rolls that contain milk. Prepared mixes such as muffins, biscuits, waffles, pancakes. Sweet rolls, donuts, [place] toast (if made with milk or lactose).  Crackers: Soda crackers, graham crackers. Any crackers prepared without lactose. Zwieback crackers, corn curls, or any that contain lactose.  Cereals: Cooked or dry cereals prepared without lactose (read labels). Cooked or dry cereals prepared with lactose (read labels). Total, Cocoa Krispies. Special K.  Potatoes / Pasta / Rice: Any prepared without milk or lactose. Popcorn. Instant potatoes, frozen [place] fries, scalloped or au gratin potatoes.  VEGETABLES 2 servings or more Fresh, frozen, and canned vegetables. Creamed or breaded vegetables. Vegetables in a cheese sauce or with lactose-containing margarines.  FRUIT 2 servings or more All fresh, canned, or frozen fruits that are not processed with lactose. Any canned or frozen fruits processed with lactose.  MEAT & SUBSTITUTES 2 servings or more (4 to 6 oz. total per day) Plain beef, chicken, fish, [place], lamb, veal, pork, or ham. Kosher prepared meat products. Strained or junior meats that do not contain milk. Eggs, soy meat substitutes, nuts. Scrambled eggs, omelets, and souffles that contain milk. Creamed or breaded meat, fish, or fowl. Sausage products such as wieners, liver sausage, or cold cuts that contain milk solids. Cheese, cottage cheese, or cheese spreads.  MILK None. (See BEVERAGES for milk substitutes. See DESSERTS for ice cream and frozen desserts.) Milk (whole, 2%, skim, or chocolate). Evaporated,  powdered, or condensed milk; malted milk.  SOUPS & COMBINATION FOODS Bouillon, broth, vegetable soups, clear soups, consomms. Homemade soups made with allowed ingredients. Combination or prepared foods that do not contain milk or milk products (read labels). Cream soups, chowders, commercially prepared soups containing lactose. Macaroni and cheese, pizza. Combination or prepared foods that contain milk or milk products.  DESSERTS & SWEETS In moderation Water and fruit ices; gelatin; angel food cake. Homemade cookies, pies, or cakes made from allowed ingredients. Pudding (if made with water or a milk substitute). Lactose-free tofu desserts. Sugar, honey, corn syrup, jam, jelly; marmalade; molasses (beet sugar); Pure sugar candy; marshmallows. Ice cream, ice milk, sherbet, custard, pudding, frozen yogurt. Commercial cake and cookie mixes. Desserts that contain chocolate. Pie  crust made with milk-containing margarine; reduced-calorie desserts made with a sugar substitute that contains lactose. Toffee, peppermint, butterscotch, chocolate, caramels.  FATS & OILS In moderation Butter (as tolerated; contains very small amounts of lactose). Margarines and dressings that do not contain milk, Vegetable oils, shortening, Miracle Whip, mayonnaise, nondairy cream & whipped toppings without lactose or milk solids added (examples: Coffee Rich, Carnation Coffeemate, Rich's Whipped Topping, PolyRich). Tomasa Blase. Margarines and salad dressings containing milk; cream, cream cheese; peanut butter with added milk solids, sour cream, chip dips, made with sour cream.  BEVERAGES Carbonated drinks; tea; coffee and freeze-dried coffee; some instant coffees (check labels). Fruit drinks; fruit and vegetable juice; Rice or Soy milk. Ovaltine, hot chocolate. Some cocoas; some instant coffees; instant iced teas; powdered fruit drinks (read labels).   CONDIMENTS / MISCELLANEOUS Soy sauce, carob powder, olives, gravy made with water,  baker's cocoa, pickles, pure seasonings and spices, wine, pure monosodium glutamate, catsup, mustard. Some chewing gums, chocolate, some cocoas. Certain antibiotics and vitamin / mineral preparations. Spice blends if they contain milk products. MSG extender. Artificial sweeteners that contain lactose such as Equal (Nutra-Sweet) and Sweet 'n Low. Some nondairy creamers (read labels).   SAMPLE MENU*  Breakfast   Orange Juice.  Banana.   Bran flakes.   Nondairy Creamer.  Vienna Bread (toasted).   Butter or milk-free margarine.   Coffee or tea.    Noon Meal   Chicken Breast.  Rice.   Green beans.   Butter or milk-free margarine.  Fresh melon.   Coffee or tea.    Evening Meal   Roast Beef.  Baked potato.   Butter or milk-free margarine.   Broccoli.   Lettuce salad with vinegar and oil dressing.  [employer].   Coffee or tea.      Hemorrhoids Hemorrhoids are dilated (enlarged) veins around the rectum. Sometimes clots will form in the veins. This makes them swollen and painful. These are called thrombosed hemorrhoids. Causes of hemorrhoids include:  Constipation.   Straining to have a bowel movement.   HEAVY LIFTING  HOME CARE INSTRUCTIONS  Eat a well balanced diet and drink 6 to 8 glasses of water every day to avoid constipation. You may also use a bulk laxative.   Avoid straining to have bowel movements.   Keep anal area dry and clean.   Do not use a donut shaped pillow or sit on the toilet for long periods. This increases blood pooling and pain.   Move your bowels when your body has the urge; this will require less straining and will decrease pain and pressure.

## 2016-06-04 NOTE — H&P (Signed)
Primary Care Physician:  Raylene Everts, MD Primary Gastroenterologist:  Dr. Oneida Alar  Pre-Procedure History & Physical: HPI:  Kelly [name] is a 31 y.o. female here for BRBPR/ABDOMINAL PAIN.  Past Medical History:  Diagnosis Date  . Abrasion of right knee    patient had an abrasion of right knee on arrival to endoscopy  . GERD (gastroesophageal reflux disease)   . Hypertension   . Situational depression / anxiety 04/26/2016    Past Surgical History:  Procedure Laterality Date  . ANTERIOR CRUCIATE LIGAMENT REPAIR  [id number]  . BREAST SURGERY  2008   reconstruction    Prior to Admission medications   Medication Sig Start Date End Date Taking? Authorizing Provider  Multiple Vitamin (MULTIVITAMIN WITH MINERALS) TABS tablet Take 1 tablet by mouth once a week.   Yes Historical Provider, MD  naproxen sodium (ANAPROX) 220 MG tablet Take 440 mg by mouth daily as needed (pain).   Yes Historical Provider, MD  norethindrone-ethinyl estradiol (MICROGESTIN,JUNEL,LOESTRIN) 1-20 MG-MCG tablet Take 1 tablet by mouth daily.   Yes Historical Provider, MD  pantoprazole (PROTONIX) 40 MG tablet Take 1 tablet (40 mg total) by mouth daily. 04/26/16  Yes Raylene Everts, MD  peg 3350 powder (MOVIPREP) 100 g SOLR Take 1 kit (200 g total) by mouth as directed. 05/20/16  Yes Danie Binder, MD    Allergies as of 05/20/2016  . (No Known Allergies)    Family History  Problem Relation Age of Onset  . Cancer Mother     breast  . Autoimmune disease Mother     devin  . Cancer Father     skin  . Diabetes Father   . Hypertension Father   . Hyperlipidemia Maternal Grandmother   . Diabetes Maternal Grandfather   . Hyperlipidemia Maternal Grandfather   . Colon cancer Paternal Grandmother     colon cancer at 69 and 10  . ALS Paternal Grandfather 88    Social History   Social History  . Marital status: Single    Spouse name: N/A  . Number of children: 0  . Years of education: 62   Occupational  History  . admin    Social History Main Topics  . Smoking status: Never Smoker  . Smokeless tobacco: Never Used  . Alcohol use No  . Drug use: No  . Sexual activity: Not Currently   Other Topics Concern  . Not on file   Social History Narrative   Graduated [employer] with degree political science      Works as admin asst      Lives with parents      2 dogs          Review of Systems: See HPI, otherwise negative ROS   Physical Exam: BP (!) 149/69   Pulse (!) 120   Temp 98.6 F (37 C) (Oral)   Resp 17   Ht 5' 6" (1.676 m)   Wt 210 lb (95.3 kg)   LMP 05/25/2016 (Exact Date)   SpO2 98%   BMI 33.89 kg/m  General:   Alert,  pleasant and cooperative in NAD Head:  Normocephalic and atraumatic. Neck:  Supple; Lungs:  Clear throughout to auscultation.    Heart:  Regular rate and rhythm. Abdomen:  Soft, nontender and nondistended. Normal bowel sounds, without guarding, and without rebound.   Neurologic:  Alert and  oriented x4;  grossly normal neurologically.  Impression/Plan:     BRBPR/ABDOMINAL PAIN   PLAN: TCS TODAY. DISCUSSED PROCEDURE,  BENEFITS, & RISKS: < 1% chance of medication reaction, bleeding, perforation, or rupture of spleen/liver.    

## 2016-06-09 ENCOUNTER — Encounter (HOSPITAL_COMMUNITY): Payer: Self-pay | Admitting: Gastroenterology

## 2016-06-24 ENCOUNTER — Encounter: Payer: Self-pay | Admitting: "Endocrinology

## 2016-06-24 ENCOUNTER — Ambulatory Visit (INDEPENDENT_AMBULATORY_CARE_PROVIDER_SITE_OTHER): Payer: 59 | Admitting: "Endocrinology

## 2016-06-24 VITALS — BP 144/78 | HR 130 | Ht 66.0 in | Wt 207.0 lb

## 2016-06-24 DIAGNOSIS — E0590 Thyrotoxicosis, unspecified without thyrotoxic crisis or storm: Secondary | ICD-10-CM | POA: Insufficient documentation

## 2016-06-24 HISTORY — DX: Thyrotoxicosis, unspecified without thyrotoxic crisis or storm: E05.90

## 2016-06-24 MED ORDER — PROPRANOLOL HCL 40 MG PO TABS: 40.0000 mg | ORAL_TABLET | Freq: Three times a day (TID) | ORAL | 1 refills | Status: DC

## 2016-06-24 NOTE — Progress Notes (Signed)
Subjective:    Patient ID: Kelly [name], female    DOB: Sep 18, 1985, PCP Eustace Moore, MD.   Past Medical History:  Diagnosis Date  . Abrasion of right knee    patient had an abrasion of right knee on arrival to endoscopy  . GERD (gastroesophageal reflux disease)   . Hypertension   . Hyperthyroidism 06/24/2016  . Situational depression / anxiety 04/26/2016   Past Surgical History:  Procedure Laterality Date  . ANTERIOR CRUCIATE LIGAMENT REPAIR  [id number]  . BREAST SURGERY  2008   reconstruction  . COLONOSCOPY N/A 06/04/2016   Procedure: COLONOSCOPY;  Surgeon: West Bali, MD;  Location: [facility];  Service: Endoscopy;  Laterality: N/A;  115   Social History   Social History  . Marital status: Single    Spouse name: N/A  . Number of children: 0  . Years of education: 65   Occupational History  . admin    Social History Main Topics  . Smoking status: Never Smoker  . Smokeless tobacco: Never Used  . Alcohol use No  . Drug use: No  . Sexual activity: Not Currently   Other Topics Concern  . None   Social History Narrative   Economist with degree political science      Works as admin asst      Lives with parents      2 dogs         Outpatient Encounter Prescriptions as of 06/24/2016  Medication Sig  . norethindrone-ethinyl estradiol (MICROGESTIN,JUNEL,LOESTRIN) 1-20 MG-MCG tablet Take 1 tablet by mouth daily.  . pantoprazole (PROTONIX) 40 MG tablet Take 1 tablet (40 mg total) by mouth daily.  . propranolol (INDERAL) 40 MG tablet Take 1 tablet (40 mg total) by mouth 3 (three) times daily.  . [DISCONTINUED] Eluxadoline (VIBERZI) 75 MG TABS Take 75 mg by mouth 2 (two) times daily.  . [DISCONTINUED] Multiple Vitamin (MULTIVITAMIN WITH MINERALS) TABS tablet Take 1 tablet by mouth once a week.  . [DISCONTINUED] naproxen sodium (ANAPROX) 220 MG tablet Take 440 mg by mouth daily as needed (pain).   No facility-administered encounter medications on file  as of 06/24/2016.    ALLERGIES: No Known Allergies VACCINATION STATUS:  There is no immunization history on file for this patient.  HPI  The patient presents today with a medical history as above, and is being seen in consultation for hyperthyroidism requested by Dr. Delton See.  The patient has been dealing with symptoms of  weight loss of 10 pounds over 2 months, anxiety, sleep disturbance, irritability, tremors, and heat intolerance and sweating for approximately 6 month. These symptoms are progressively worsening and troubling to patient.   The patient denies family history of thyroid dysfunction  ,  and the patient denies personal history of goiter. Patient is not on any anti-thyroid medications nor on any thyroid hormone supplements. Patient  is willing to proceed with appropriate work up and therapy for thyrotoxicosis.   Review of Systems  Objective:    BP (!) 144/78   Pulse (!) 130   Ht 5\' 6"  (1.676 m)   Wt 207 lb (93.9 kg)   LMP 05/25/2016 (Exact Date)   BMI 33.41 kg/m   Wt Readings from Last 3 Encounters:  06/24/16 207 lb (93.9 kg)  06/04/16 210 lb (95.3 kg)  06/01/16 210 lb (95.3 kg)    Physical Exam   Constitutional: +weight loss, + fatigue, + subjective hyperthermia Eyes: no blurry vision, no xerophthalmia ENT: no  sore throat, no nodules palpated in throat, no dysphagia/odynophagia, no hoarseness Cardiovascular: no Chest Pain, no Shortness of Breath,  + tachycardic at 130 , no leg swelling Respiratory: no cough, no SOB Gastrointestinal: no Nausea/Vomiting/Diarhhea Musculoskeletal: no muscle/joint aches Skin: no rashes, warm and moist skin Neurological: + tremors, no numbness, no tingling, no dizziness Psychiatric: no depression, +anxiety   Results for orders placed or performed in visit on 05/26/16  Clostridium Difficile by PCR  Result Value Ref Range   Toxigenic C Difficile by pcr Not Detected Not Detected  Gastrointestinal Pathogen Panel PCR  Result Value  Ref Range   Campylobacter, PCR Indeterm (A)    C. difficile Tox A/B, PCR Indeterm (A)    E coli 0157, PCR Indeterm (A)    E coli (ETEC) LT/ST PCR Indeterm (A)    E coli (STEC) stx1/stx2, PCR Indeterm (A)    Salmonella, PCR Indeterm (A)    Shigella, PCR Indeterm (A)    Norovirus, PCR Indeterm (A)    Rotavirus A, PCR Indeterm (A)    Giardia lamblia, PCR Indeterm (A)    Cryptosporidium, PCR Indeterm (A)    Complete Blood Count (Most recent): Lab Results  Component Value Date   WBC 5.9 04/26/2016   HGB 13.4 04/26/2016   HCT 40.0 04/26/2016   MCV 79.4 (L) 04/26/2016   PLT 270 04/26/2016   Chemistry (most recent): Lab Results  Component Value Date   NA 141 04/26/2016   K 3.9 04/26/2016   CL 109 04/26/2016   CO2 21 04/26/2016   BUN 7 04/26/2016   CREATININE 0.50 04/26/2016    Results for REEVE, MALLO (MRN [id number]) as of 06/24/2016 13:28  Ref. Range 06/01/2016 16:36  Hemoglobin A1C Latest Ref Range: <5.7 % 4.9  TSH Latest Units: mIU/L 0.01 (L)  Triiodothyronine (T3) Latest Ref Range: 76 - 181 ng/dL 409.8 (H)  Thyroxine (T4) Latest Ref Range: 4.5 - 12.0 ug/dL 11.9 (H)  Thyroperoxidase Ab SerPl-aCnc Latest Ref Range: <9 IU/mL 125 (H)   Lipid Panel     Component Value Date/Time   CHOL 109 04/26/2016 1400   TRIG 87 04/26/2016 1400   HDL 45 (L) 04/26/2016 1400   CHOLHDL 2.4 04/26/2016 1400   VLDL 17 04/26/2016 1400   LDLCALC 47 04/26/2016 1400    Assessment & Plan:   1. Hyperthyroidism  Patient's history and most recent labs are reviewed. Findings are consistent with thyrotoxicosis likely from hyperthyroidism. The potential risks of untreated thyrotoxicosis and the need for definitive therapy have been discussed in detail with the patient, and the patient agrees to proceed with plan.   I like to obtain confirmatory thyroid uptake and scan which  will be scheduled to be done as soon as possible.   Therapy may involve RAI ablation of the thyroid, with subsequent need for  lifelong thyroid hormone replacement. Pt is made aware of this fact and willing to proceed. The patient will return in 1 week for treatment decision. I  will initiate low dose Propranolol 40 mg by mouth twice a day for symptomatic relief.  - 45 minutes of time was spent on the care of this patient , 50% of which was applied for counseling on complications of thyrotoxicosis, options ,  and the need for definitive therapy to prevent these complications.  - I advised patient to maintain close follow up with Eustace Moore, MD for primary care needs.  Follow up plan: Return in about 1 week (around 07/01/2016) for thyroid uptake and scan.  Kelly LunchGebre Rudolpho Claxton, MD Phone: [phone]  Fax: [phone]   06/24/2016, 2:13 PM

## 2016-06-28 ENCOUNTER — Encounter (HOSPITAL_COMMUNITY): Payer: Self-pay

## 2016-06-28 ENCOUNTER — Encounter (HOSPITAL_COMMUNITY)
Admission: RE | Admit: 2016-06-28 | Discharge: 2016-06-28 | Disposition: A | Discharge status: Home / Self Care | Payer: 59 | Source: Ambulatory Visit | Attending: "Endocrinology | Admitting: "Endocrinology

## 2016-06-28 DIAGNOSIS — E0590 Thyrotoxicosis, unspecified without thyrotoxic crisis or storm: Secondary | ICD-10-CM

## 2016-06-28 MED ORDER — SODIUM IODIDE I-123 7.4 MBQ CAPS
359.0000 | ORAL_CAPSULE | Freq: Once | ORAL | Status: AC
  Administered 2016-06-28: 359 via ORAL

## 2016-06-29 ENCOUNTER — Encounter (HOSPITAL_COMMUNITY)
Admission: RE | Admit: 2016-06-29 | Discharge: 2016-06-29 | Disposition: A | Discharge status: Home / Self Care | Payer: 59 | Source: Ambulatory Visit | Attending: "Endocrinology | Admitting: "Endocrinology

## 2016-06-29 DIAGNOSIS — E0590 Thyrotoxicosis, unspecified without thyrotoxic crisis or storm: Secondary | ICD-10-CM | POA: Diagnosis present

## 2016-06-30 ENCOUNTER — Encounter: Payer: Self-pay | Admitting: "Endocrinology

## 2016-06-30 ENCOUNTER — Ambulatory Visit (INDEPENDENT_AMBULATORY_CARE_PROVIDER_SITE_OTHER): Payer: 59 | Admitting: "Endocrinology

## 2016-06-30 ENCOUNTER — Telehealth: Payer: Self-pay | Admitting: Gastroenterology

## 2016-06-30 VITALS — BP 123/77 | HR 83 | Ht 66.0 in | Wt 204.0 lb

## 2016-06-30 DIAGNOSIS — E0590 Thyrotoxicosis, unspecified without thyrotoxic crisis or storm: Secondary | ICD-10-CM

## 2016-06-30 DIAGNOSIS — E0500 Thyrotoxicosis with diffuse goiter without thyrotoxic crisis or storm: Secondary | ICD-10-CM | POA: Diagnosis not present

## 2016-06-30 NOTE — Progress Notes (Signed)
Subjective:    Patient ID: Kelly [name], female    DOB: Nov 09, 1985, PCP Kelly Moore, MD.   Past Medical History:  Diagnosis Date  . Abrasion of right knee    patient had an abrasion of right knee on arrival to endoscopy  . GERD (gastroesophageal reflux disease)   . Hypertension   . Hyperthyroidism 06/24/2016  . Situational depression / anxiety 04/26/2016   Past Surgical History:  Procedure Laterality Date  . ANTERIOR CRUCIATE LIGAMENT REPAIR  [id number]  . BREAST SURGERY  2008   reconstruction  . COLONOSCOPY N/A 06/04/2016   Procedure: COLONOSCOPY;  Surgeon: Kelly Bali, MD;  Location: [facility];  Service: Endoscopy;  Laterality: N/A;  115   Social History   Social History  . Marital status: Single    Spouse name: N/A  . Number of children: 0  . Years of education: 91   Occupational History  . admin    Social History Main Topics  . Smoking status: Never Smoker  . Smokeless tobacco: Never Used  . Alcohol use No  . Drug use: No  . Sexual activity: Not Currently   Other Topics Concern  . 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 norethindrone-ethinyl estradiol (MICROGESTIN,JUNEL,LOESTRIN) 1-20 MG-MCG tablet Take 1 tablet by mouth daily.  . pantoprazole (PROTONIX) 40 MG tablet Take 1 tablet (40 mg total) by mouth daily.  . propranolol (INDERAL) 40 MG tablet Take 1 tablet (40 mg total) by mouth 3 (three) times daily.   No facility-administered encounter medications on file as of 06/30/2016.    ALLERGIES: No Known Allergies VACCINATION STATUS:  There is no immunization history on file for this patient.  HPI  The patient presents today with a medical history as above, and is being seen in f/u  for hyperthyroidism . -  She underwent thyroid uptake and scan on 06/29/2016  which confirms Graves' disease as a cause of hyperthyroidism. - The patient has been dealing with symptoms of  weight loss of 10 pounds over 2 months, anxiety, sleep disturbance, irritability, tremors, and heat intolerance and sweating for approximately 6 month. These symptoms are somewhat better after she was initiated on propranolol 40 mg by mouth twice a day.   The patient denies family history of thyroid dysfunction  ,  and the patient denies personal history of goiter. Patient is not on any anti-thyroid medications nor on any thyroid hormone supplements. Patient  is willing to proceed with appropriate work up and therapy for thyrotoxicosis.   Review of Systems  Objective:    BP 123/77   Pulse 83   Ht 5\' 6"  (1.676 m)   Wt 204 lb (92.5 kg)   LMP 06/28/2016 (Exact Date)   BMI 32.93 kg/m   Wt Readings from Last 3 Encounters:  06/30/16 204 lb (92.5 kg)  06/24/16 207 lb (93.9 kg)  06/04/16 210 lb (95.3 kg)    Physical Exam   Constitutional: +weight loss, + fatigue, + subjective hyperthermia Eyes: no blurry vision, no xerophthalmia ENT: no sore throat, no nodules palpated in throat, no dysphagia/odynophagia, no hoarseness Cardiovascular: no Chest Pain, no Shortness of Breath,  + tachycardic resolved.  no leg swelling Respiratory: no  cough, no SOB Gastrointestinal: no Nausea/Vomiting/Diarhhea Musculoskeletal: no muscle/joint aches Skin: no rashes, warm and moist skin Neurological: + tremors, no numbness, no tingling, no dizziness Psychiatric: no depression, +anxiety   Results for orders placed or performed in visit on 05/26/16  Clostridium Difficile by PCR  Result Value Ref Range   Toxigenic C Difficile by pcr Not Detected Not Detected  Gastrointestinal Pathogen Panel PCR  Result Value Ref Range   Campylobacter, PCR Indeterm (A)    C. difficile Tox A/B, PCR Indeterm (A)    E coli 0157, PCR Indeterm (A)    E coli (ETEC) LT/ST PCR Indeterm (A)    E coli (STEC) stx1/stx2, PCR  Indeterm (A)    Salmonella, PCR Indeterm (A)    Shigella, PCR Indeterm (A)    Norovirus, PCR Indeterm (A)    Rotavirus A, PCR Indeterm (A)    Giardia lamblia, PCR Indeterm (A)    Cryptosporidium, PCR Indeterm (A)    Complete Blood Count (Most recent): Lab Results  Component Value Date   WBC 5.9 04/26/2016   HGB 13.4 04/26/2016   HCT 40.0 04/26/2016   MCV 79.4 (L) 04/26/2016   PLT 270 04/26/2016   Chemistry (most recent): Lab Results  Component Value Date   NA 141 04/26/2016   K 3.9 04/26/2016   CL 109 04/26/2016   CO2 21 04/26/2016   BUN 7 04/26/2016   CREATININE 0.50 04/26/2016    Results for Kelly [name], Kelly [name] (MRN [id number]) as of 06/24/2016 13:28  Ref. Range 06/01/2016 16:36  Hemoglobin A1C Latest Ref Range: <5.7 % 4.9  TSH Latest Units: mIU/L 0.01 (L)  Triiodothyronine (T3) Latest Ref Range: 76 - 181 ng/dL 284.1591.0 (H)  Thyroxine (T4) Latest Ref Range: 4.5 - 12.0 ug/dL 32.430.3 (H)  Thyroperoxidase Ab SerPl-aCnc Latest Ref Range: <9 IU/mL 125 (H)   Lipid Panel     Component Value Date/Time   CHOL 109 04/26/2016 1400   TRIG 87 04/26/2016 1400   HDL 45 (L) 04/26/2016 1400   CHOLHDL 2.4 04/26/2016 1400   VLDL 17 04/26/2016 1400   LDLCALC 47 04/26/2016 1400    Thyroid uptake and scan from 06/29/2016 showed uniform uptake of 63.9% consistent with Graves' disease.  Assessment & Plan:   1. Hyperthyroidism 2. Graves' disease  Patient's history and most recent labs are reviewed. Findings are consistent with hyperthyroidism From Graves' disease. The potential risks of untreated thyrotoxicosis and the need for definitive therapy have been discussed in detail with the patient, and the patient agrees to proceed with plan.  - Options of therapy were discussed with her. Best choice of therapy will involve RAI ablation of the thyroid, with subsequent need for lifelong thyroid hormone replacement. Pt is made aware of this fact and willing to proceed. - This treatment will be scheduled to  be administered as soon as possible, patient states that she has some commitments in the next 20 days including troubles and would like to have it done at the end of May 2018  I  advised her to continue  low dose Propranolol 40 mg by mouth twice a day for symptomatic relief.  - I advised patient to maintain close follow up with Delton SeeNelson, Letta PateYvonne Sue, MD for primary care needs.  Follow up plan: Return in about 11 weeks (around 09/15/2016) for with labs after I131 therapy.  Marquis LunchGebre Shyvonne Chastang, MD Phone: [phone]  Fax: [phone]   06/30/2016, 11:41 AM

## 2016-06-30 NOTE — Telephone Encounter (Signed)
LMOM for a return call.  

## 2016-06-30 NOTE — Telephone Encounter (Signed)
Reminder in epic °

## 2016-06-30 NOTE — Telephone Encounter (Signed)
Please call pt. Her colon biopsies are normal. HER LOOSE STOOL MAY BE DUE TO IBS AND THYROID DISEASE.   TO REDUCE ABDOMINAL PAIN AND WATERY STOOL YOU CAN USE VIBERZI. DO NOT CONSUME MORE THAN 3 ALCOHOLIC DRINKS A DAY WHILE TAKING VIBERZI.  DRINK WATER TO KEEP YOUR URINE LIGHT YELLOW.  FOLLOW A HIGH FIBER DIET. AVOID ITEMS THAT CAUSE BLOATING.   USE PREPARATION H FOUR TIMES  A DAY IF NEEDED TO RELIEVE RECTAL PAIN/PRESSURE/BLEEDING.  FOLLOW UP IN AUG 2018 E30 ABDOMINAL PAIN/DIARRHEA/HYPERTHYROIDISM.

## 2016-07-01 NOTE — Telephone Encounter (Signed)
PT called and was informed of results. She said to let Dr. Darrick PennaFields know that she was just diagnosed yesterday with Graves disease by Dr. Fransico HimNida. Her stools are more solid now, a little dark. She will let us know if they get black and tarry.

## 2016-07-01 NOTE — Telephone Encounter (Signed)
Also forwarding FYI to Wynne DustEric Gill, NP.

## 2016-07-01 NOTE — Telephone Encounter (Signed)
Unable to reach pt by phone. Mailing this print out.

## 2016-07-01 NOTE — Telephone Encounter (Signed)
REVIEWED-NO ADDITIONAL RECOMMENDATIONS. 

## 2016-07-01 NOTE — Progress Notes (Signed)
LMOM for a return call.  

## 2016-07-01 NOTE — Progress Notes (Signed)
PT is aware and said she is not having diarrhea now, stools are more formed. They have been a little dark, but not black and tarry, and she will let us know if that is a problem. ( Also, just diagnosed with Graves by Dr. Fransico HimNida).

## 2016-07-02 NOTE — Telephone Encounter (Signed)
Noted, no further recommendations at this time. 

## 2016-07-06 ENCOUNTER — Ambulatory Visit (HOSPITAL_COMMUNITY): Payer: Managed Care, Other (non HMO)

## 2016-07-21 ENCOUNTER — Ambulatory Visit: Payer: Managed Care, Other (non HMO) | Admitting: Nurse Practitioner

## 2016-07-23 ENCOUNTER — Encounter (HOSPITAL_COMMUNITY)
Admission: RE | Admit: 2016-07-23 | Discharge: 2016-07-23 | Disposition: A | Discharge status: Home / Self Care | Payer: Managed Care, Other (non HMO) | Source: Ambulatory Visit | Attending: "Endocrinology | Admitting: "Endocrinology

## 2016-07-23 ENCOUNTER — Encounter (HOSPITAL_COMMUNITY): Payer: Self-pay

## 2016-07-23 DIAGNOSIS — E0590 Thyrotoxicosis, unspecified without thyrotoxic crisis or storm: Secondary | ICD-10-CM | POA: Diagnosis not present

## 2016-07-23 LAB — I-STAT BETA HCG BLOOD, ED (NOT ORDERABLE)

## 2016-07-23 MED ORDER — SODIUM IODIDE I 131 CAPSULE
12.0000 | Freq: Once | INTRAVENOUS | Status: AC | PRN
  Administered 2016-07-23: 12 via ORAL

## 2016-07-26 ENCOUNTER — Encounter: Payer: Self-pay | Admitting: Nurse Practitioner

## 2016-08-10 ENCOUNTER — Encounter: Payer: Self-pay | Admitting: "Endocrinology

## 2016-08-10 ENCOUNTER — Ambulatory Visit: Payer: 59 | Admitting: "Endocrinology

## 2016-08-26 ENCOUNTER — Ambulatory Visit: Payer: Managed Care, Other (non HMO) | Admitting: Nurse Practitioner

## 2016-09-09 ENCOUNTER — Ambulatory Visit: Payer: Managed Care, Other (non HMO) | Admitting: Nurse Practitioner

## 2016-09-14 ENCOUNTER — Other Ambulatory Visit: Payer: Self-pay | Admitting: "Endocrinology

## 2016-09-15 LAB — TSH: TSH: 0.17 mIU/L — ABNORMAL LOW

## 2016-09-15 LAB — T4, FREE: Free T4: 0.7 ng/dL — ABNORMAL LOW (ref 0.8–1.8)

## 2016-09-21 ENCOUNTER — Ambulatory Visit (INDEPENDENT_AMBULATORY_CARE_PROVIDER_SITE_OTHER): Payer: 59 | Admitting: "Endocrinology

## 2016-09-21 ENCOUNTER — Encounter: Payer: Self-pay | Admitting: "Endocrinology

## 2016-09-21 VITALS — BP 134/87 | HR 67 | Ht 66.0 in | Wt 223.0 lb

## 2016-09-21 DIAGNOSIS — E890 Postprocedural hypothyroidism: Secondary | ICD-10-CM | POA: Insufficient documentation

## 2016-09-21 MED ORDER — LEVOTHYROXINE SODIUM 100 MCG PO TABS: 100.0000 ug | ORAL_TABLET | Freq: Every day | ORAL | 3 refills | Status: DC

## 2016-09-21 NOTE — Progress Notes (Signed)
Subjective:    Patient ID: Kelly [name], female    DOB: 1985/09/15, PCP Kelly [name], Kelly Sue, MD.   Past Medical History:  Diagnosis Date  . Abrasion of right knee    patient had an abrasion of right knee on arrival to endoscopy  . GERD (gastroesophageal reflux disease)   . Hypertension   . Hyperthyroidism 06/24/2016  . Situational depression / anxiety 04/26/2016   Past Surgical History:  Procedure Laterality Date  . ANTERIOR CRUCIATE LIGAMENT REPAIR  [id number],2009  . BREAST SURGERY  2008   reconstruction  . COLONOSCOPY N/A 06/04/2016   Procedure: COLONOSCOPY;  Surgeon: West BaliSandi L Fields, MD;  Location: [facility];  Service: Endoscopy;  Laterality: N/A;  115   Social History   Social History  . Marital status: Single    Spouse name: N/A  . Number of children: 0  . Years of education: 4116   Occupational History  . admin    Social History Main Topics  . Smoking status: Never Smoker  . Smokeless tobacco: Never Used  . Alcohol use No  . Drug use: No  . Sexual activity: Not Currently   Other Topics Concern  . 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 levothyroxine (SYNTHROID, LEVOTHROID) 100 MCG tablet Take 1 tablet (100 mcg total) by mouth daily before breakfast.  . norethindrone-ethinyl estradiol (MICROGESTIN,JUNEL,LOESTRIN) 1-20 MG-MCG tablet Take 1 tablet by mouth daily.  . pantoprazole (PROTONIX) 40 MG tablet Take 1 tablet (40 mg total) by mouth daily.  . [DISCONTINUED] propranolol (INDERAL) 40 MG tablet Take 1 tablet (40 mg total) by mouth 3 (three) times daily.   No facility-administered encounter medications on file as of 09/21/2016.    ALLERGIES: No Known Allergies VACCINATION STATUS:  There is no immunization history on file for this patient.  HPI  The patient presents today  with a medical history as above, and is being seen in f/u  for hyperthyroidism . -  She underwent thyroid uptake and scan on 06/29/2016 which confirms Graves' disease as a cause of hyperthyroidism. - She is status post RAI therapy on 07/23/2016. - She reports improvement in her symptoms including anxiety, sleep disturbance, irritability, tremors, and heat intolerance and sweating. - She is regaining the weight she lost during the course of hyperthyroidism.  The patient denies family history of thyroid dysfunction  ,  and  denies personal history of goiter.    Review of Systems Constitutional: +weight gian, - fatigue, - subjective hyperthermia Eyes: no blurry vision, no xerophthalmia ENT: no sore throat, no nodules palpated in throat, no dysphagia/odynophagia, no hoarseness Cardiovascular: no Chest Pain, no Shortness of Breath,  - tachycardia,  no leg swelling Respiratory: no cough, no SOB Gastrointestinal: no Nausea/Vomiting/Diarhhea Musculoskeletal: no muscle/joint aches Skin: no rashes, warm and moist skin Neurological: - tremors, no numbness, no tingling, no dizziness Psychiatric: no depression, +anxiety  Objective:    BP 134/87   Pulse 67   Ht 5\' 6"  (1.676 m)   Wt 223 lb (101.2 kg)   BMI 35.99 kg/m   Wt Readings from Last 3 Encounters:  09/21/16 223 lb (101.2 kg)  06/30/16 204 lb (92.5 kg)  06/24/16 207 lb (93.9 kg)  Physical Exam   Constitutional: Obese for hight, not in acute distress, normal state of mind Eyes: PERRLA, EOMI, no exophthalmos ENT: moist mucous membranes, no thyromegaly, no cervical lymphadenopathy Cardiovascular: normal precordial activity, Regular Rate and Rhythm, no Murmur/Rubs/Gallops Respiratory:  adequate breathing efforts, no gross chest deformity, Clear to auscultation bilaterally Gastrointestinal: abdomen soft, Non -tender, No distension, Bowel Sounds present Musculoskeletal: no gross deformities, strength intact in all four extremities Skin:  moist, warm, no rashes Neurological: no tremor with outstretched hands, Deep tendon reflexes normal in all four extremities.    Results for orders placed or performed in visit on 09/14/16  TSH  Result Value Ref Range   TSH 0.17 (L) mIU/L  T4, free  Result Value Ref Range   Free T4 0.7 (L) 0.8 - 1.8 ng/dL   Complete Blood Count (Most recent): Lab Results  Component Value Date   WBC 5.9 04/26/2016   HGB 13.4 04/26/2016   HCT 40.0 04/26/2016   MCV 79.4 (L) 04/26/2016   PLT 270 04/26/2016   Chemistry (most recent): Lab Results  Component Value Date   NA 141 04/26/2016   K 3.9 04/26/2016   CL 109 04/26/2016   CO2 21 04/26/2016   BUN 7 04/26/2016   CREATININE 0.50 04/26/2016     Lipid Panel     Component Value Date/Time   CHOL 109 04/26/2016 1400   TRIG 87 04/26/2016 1400   HDL 45 (L) 04/26/2016 1400   CHOLHDL 2.4 04/26/2016 1400   VLDL 17 04/26/2016 1400   LDLCALC 47 04/26/2016 1400    1) Thyroid uptake and scan from 06/29/2016 showed uniform uptake of 63.9% consistent with Graves' disease.  2) RAI therapy on 07/23/2016.   Assessment & Plan:   1. Hypothyroidism Due to RAI therapy for Graves' disease - She is status post RAI therapy for Graves' disease on 07/23/2016. Heart thyroid function tests are consistent with RAI induced hypothyroidism.  - I have discussed and initiated thyroid hormone replacement. - I will start with levothyroxine 100 g by mouth every morning, likely will require higher doses on subsequent visits -- We discussed about correct intake of levothyroxine, at fasting, with water, separated by at least 30 minutes from breakfast, and separated by more than 4 hours from calcium, iron, multivitamins, acid reflux medications (PPIs). -Patient is made aware of the fact that thyroid hormone replacement is needed for life, dose to be adjusted by periodic monitoring of thyroid function tests.  I  will discontinue Propranolol . - I advised patient to  maintain close follow up with Kelly [name], Kelly Sue, MD for primary care needs.  Follow up plan: Return in about 3 months (around 12/22/2016) for follow up with pre-visit labs.  [name], MD Phone: [phone]  Fax: [phone]   09/21/2016, 2:41 PM

## 2016-11-01 ENCOUNTER — Encounter: Payer: Self-pay | Admitting: Nurse Practitioner

## 2016-11-01 ENCOUNTER — Ambulatory Visit (INDEPENDENT_AMBULATORY_CARE_PROVIDER_SITE_OTHER): Payer: 59 | Admitting: Nurse Practitioner

## 2016-11-01 VITALS — BP 142/90 | HR 89 | Temp 98.5°F | Ht 66.0 in | Wt 231.4 lb

## 2016-11-01 DIAGNOSIS — R194 Change in bowel habit: Secondary | ICD-10-CM

## 2016-11-01 DIAGNOSIS — K219 Gastro-esophageal reflux disease without esophagitis: Secondary | ICD-10-CM

## 2016-11-01 NOTE — Progress Notes (Signed)
Referring Provider: Eustace MooreNelson, Kelly Sue, MD Primary Care Physician:  Eustace MooreNelson, Kelly Sue, MD Primary GI:  Dr. Darrick PennaFields  Chief Complaint  Patient presents with  . Gastroesophageal Reflux    f/u, doing ok    HPI:   Kelly [name] is a 31 y.o. female who presents For follow-up on GERD. The patient was last seen in our office 05/20/2016 for the same. At that time it was noted she was still under significant stress, Protonix is helping with no breakthrough noted. She did have some hematochezia. Single episode of vomiting 2 weeks prior. IBS symptoms. Recommended colonoscopy for hematochezia, stool studies, continue Protonix, follow-up in 2 months.  Her stool studies were negative. At the time of call back her stools became more formed.  Colonoscopy completed 06/04/2016 which found abdominal pain and loose stools most likely due to IBS diarrhea-type, less likely microscopic colitis, redundant left colon, rectal bleeding due to internal hemorrhoids. Minute high-fiber/lactose free diet, continue present medications and add Viberzi with flares of IBS. Random biopsies were taken for evaluation for microscopic colitis. Surgical pathology found the biopsies to be benign colonic mucosa.  Today she states she's doing well overall. GERD symptoms resolved. States she was diagnosed with Graves disease and seeing endocrinology and has undergone radiation ablation of the thyroid and is now on Synthroid. Has had some weight gain, but is not on final dose yet. Denies abdominal pain, N/V, hematochezia, melena, unintentional weight loss, fever, chills. Diarrhea has become controlled and is back to baseline (1-2 days of loose stools around her Menstrual cycle). Denies chest pain, dyspnea, dizziness, lightheadedness, syncope, near syncope. Denies any other upper or lower GI symptoms.  Past Medical History:  Diagnosis Date  . Abrasion of right knee    patient had an abrasion of right knee on arrival to endoscopy  . GERD  (gastroesophageal reflux disease)   . Graves disease   . Hypertension   . Hyperthyroidism 06/24/2016  . Situational depression / anxiety 04/26/2016    Past Surgical History:  Procedure Laterality Date  . ANTERIOR CRUCIATE LIGAMENT REPAIR  [id number],2009  . BREAST SURGERY  2008   reconstruction  . COLONOSCOPY N/A 06/04/2016   Procedure: COLONOSCOPY;  Surgeon: West BaliSandi L Fields, MD;  Location: [facility];  Service: Endoscopy;  Laterality: N/A;  115    Current Outpatient Prescriptions  Medication Sig Dispense Refill  . levothyroxine (SYNTHROID, LEVOTHROID) 100 MCG tablet Take 1 tablet (100 mcg total) by mouth daily before breakfast. 30 tablet 3  . norethindrone-ethinyl estradiol (MICROGESTIN,JUNEL,LOESTRIN) 1-20 MG-MCG tablet Take 1 tablet by mouth daily.     No current facility-administered medications for this visit.     Allergies as of 11/01/2016  . (No Known Allergies)    Family History  Problem Relation Age of Onset  . Cancer Mother        breast  . Autoimmune disease Mother        devin  . Cancer Father        skin  . Diabetes Father   . Hypertension Father   . Hyperlipidemia Maternal Grandmother   . Diabetes Maternal Grandfather   . Hyperlipidemia Maternal Grandfather   . Colon cancer Paternal Grandmother        colon cancer at 3030 and 7360  . ALS Paternal Grandfather [id number]    Social History   Social History  . Marital status: Single    Spouse name: N/A  . Number of children: 0  . Years of education: 3116  Occupational History  . admin    Social History Main Topics  . Smoking status: Never Smoker  . Smokeless tobacco: Never Used  . Alcohol use No  . Drug use: No  . Sexual activity: Not Currently   Other Topics Concern  . 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   Physical Exam: BP  (!) 142/90   Pulse 89   Temp 98.5 F (36.9 C) (Oral)   Ht  (1.676 m)   Wt 231 lb 6.4 oz (105 kg)   LMP 10/25/2016 (Approximate)   BMI 37.35 kg/m  General:   Obese female. Alert and oriented. Pleasant and cooperative. Well-nourished and well-developed.  Eyes:  Without icterus, sclera clear and conjunctiva pink.  Ears:  Normal auditory acuity. Cardiovascular:  S1, S2 present without murmurs appreciated. Extremities without clubbing or edema. Respiratory:  Clear to auscultation bilaterally. No wheezes, rales, or rhonchi. No distress.  Gastrointestinal:  +BS, rounded but soft, non-tender and non-distended. No HSM noted. No guarding or rebound. No masses appreciated.  Rectal:  Deferred  Musculoskalatal:  Symmetrical without gross deformities. Neurologic:  Alert and oriented x4;  grossly normal neurologically. Psych:  Alert and cooperative. Normal mood and affect. Heme/Lymph/Immune: No excessive bruising noted.    11/01/2016 12:32 PM   Disclaimer: This note was dictated with voice recognition software. Similar sounding words can inadvertently be transcribed and may not be corrected upon review.

## 2016-11-01 NOTE — Assessment & Plan Note (Signed)
GERD/dyspepsia symptoms have resolved. The patient was recently diagnosed with Graves' disease and after radiation of for thyroid and starting Synthroid her symptoms have resolved. She is no longer on PPI. Continue follow-up with endocrinology. Return for follow-up as needed.

## 2016-11-01 NOTE — Assessment & Plan Note (Signed)
Exacerbations of diarrhea have resolved and her back to baseline which is about 1-2 days around her menstrual cycle. More persistent diarrhea likely due to Graves' disease which is just recently diagnosed. She has undergone thyroid irradiation and supplementation with Synthroid. Her symptoms have resolved since then. Return for follow-up as needed.

## 2016-11-01 NOTE — Patient Instructions (Signed)
1. Continue your current medications. 2. Follow-up with endocrinology based on their recommendations. 3. He will be next due for a colonoscopy at age 31. 4. Return for follow-up as needed for any stomach or colon issues.

## 2016-11-01 NOTE — Progress Notes (Signed)
cc'ed to pcp °

## 2017-01-18 LAB — T4, FREE: Free T4: 1.2 ng/dL (ref 0.8–1.8)

## 2017-01-18 LAB — TSH: TSH: 20.77 m[IU]/L — AB

## 2017-01-25 ENCOUNTER — Encounter: Payer: Self-pay | Admitting: "Endocrinology

## 2017-01-25 ENCOUNTER — Ambulatory Visit (INDEPENDENT_AMBULATORY_CARE_PROVIDER_SITE_OTHER): Payer: 59 | Admitting: "Endocrinology

## 2017-01-25 VITALS — BP 139/87 | HR 78 | Ht 66.0 in | Wt 244.0 lb

## 2017-01-25 DIAGNOSIS — E890 Postprocedural hypothyroidism: Secondary | ICD-10-CM

## 2017-01-25 MED ORDER — LEVOTHYROXINE SODIUM 150 MCG PO TABS: 150.0000 ug | ORAL_TABLET | Freq: Every day | ORAL | 6 refills | Status: DC

## 2017-01-25 NOTE — Progress Notes (Signed)
Subjective:    Patient ID: Kelly [name], female    DOB: 1985-11-03, PCP Eustace MooreNelson, [name], MD.   Past Medical History:  Diagnosis Date  . Abrasion of right knee    patient had an abrasion of right knee on arrival to endoscopy  . GERD (gastroesophageal reflux disease)   . Graves disease   . Hypertension   . Hyperthyroidism 06/24/2016  . Situational depression / anxiety 04/26/2016   Past Surgical History:  Procedure Laterality Date  . ANTERIOR CRUCIATE LIGAMENT REPAIR  [id number],2009  . BREAST SURGERY  2008   reconstruction  . COLONOSCOPY N/A 06/04/2016   Procedure: COLONOSCOPY;  Surgeon: West BaliSandi L Fields, MD;  Location: [facility];  Service: Endoscopy;  Laterality: N/A;  115   Social History   Socioeconomic History  . Marital status: Single    Spouse name: None  . Number of children: 0  . Years of education: 7716  . Highest education level: None  Social Needs  . Financial resource strain: None  . Food insecurity - worry: None  . Food insecurity - inability: None  . Transportation needs - medical: None  . Transportation needs - non-medical: None  Occupational History  . Occupation: admin  Tobacco Use  . Smoking status: Never Smoker  . Smokeless tobacco: Never Used  Substance and Sexual Activity  . Alcohol use: No  . Drug use: No  . Sexual activity: Not Currently  Other Topics Concern  . 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 levothyroxine (SYNTHROID, LEVOTHROID) 150 MCG tablet Take 1 tablet (150 mcg total) by mouth daily before breakfast.  . norethindrone-ethinyl estradiol (MICROGESTIN,JUNEL,LOESTRIN) 1-20 MG-MCG tablet Take 1 tablet by mouth daily.  . [DISCONTINUED] levothyroxine (SYNTHROID, LEVOTHROID) 100 MCG tablet Take 1 tablet (100 mcg total) by mouth daily before breakfast.   No  facility-administered encounter medications on file as of 01/25/2017.    ALLERGIES: No Known Allergies VACCINATION STATUS: Immunization History  Administered Date(s) Administered  . Influenza-Unspecified 12/20/2016    HPI  The patient presents today with a medical history as above, and is being seen in f/u  for hyperthyroidism .  - She is status post RAI therapy on 07/23/2016, for Graves' disease. - She was initiated on levothyroxine 100 g by mouth every morning. She continues to improve clinically. She is reporting compliance to this medication. - She has gained the weight that she lost during thyrotoxicosis. The patient denies family history of thyroid dysfunction  ,  and  denies personal history of goiter.    Review of Systems Constitutional: +weight gian, - fatigue, - subjective hyperthermia Eyes: no blurry vision, no xerophthalmia ENT: no sore throat, no nodules palpated in throat, no dysphagia/odynophagia, no hoarseness Cardiovascular: no Chest Pain, no Shortness of Breath,  - tachycardia,  no leg swelling Respiratory: no cough, no SOB Gastrointestinal: no Nausea/Vomiting/Diarhhea Musculoskeletal: no muscle/joint aches Skin: no rashes, warm and moist skin Neurological: - tremors, no numbness, no tingling, no dizziness Psychiatric: no depression, +anxiety  Objective:    BP 139/87   Pulse 78   Ht 5\' 6"  (1.676 m)   Wt 244 lb (110.7 kg)   BMI 39.38 kg/m   Wt Readings from Last 3 Encounters:  01/25/17 244  lb (110.7 kg)  11/01/16 231 lb 6.4 oz (105 kg)  09/21/16 223 lb (101.2 kg)    Physical Exam   Constitutional: Obese for height, not in acute distress, normal state of mind Eyes: PERRLA, EOMI, no exophthalmos ENT: moist mucous membranes, no thyromegaly, no cervical lymphadenopathy Cardiovascular: normal precordial activity, Regular Rate and Rhythm, no Murmur/Rubs/Gallops Respiratory:  adequate breathing efforts, no gross chest deformity, Clear to auscultation  bilaterally Gastrointestinal: abdomen soft, Non -tender, No distension, Bowel Sounds present Musculoskeletal: no gross deformities, strength intact in all four extremities Skin: moist, warm, no rashes Neurological: no tremor with outstretched hands, Deep tendon reflexes normal in all four extremities.    Results for orders placed or performed in visit on 09/21/16  TSH  Result Value Ref Range   TSH 20.77 (H) mIU/L  T4, free  Result Value Ref Range   Free T4 1.2 0.8 - 1.8 ng/dL   Complete Blood Count (Most recent): Lab Results  Component Value Date   WBC 5.9 04/26/2016   HGB 13.4 04/26/2016   HCT 40.0 04/26/2016   MCV 79.4 (L) 04/26/2016   PLT 270 04/26/2016   Chemistry (most recent): Lab Results  Component Value Date   NA 141 04/26/2016   K 3.9 04/26/2016   CL 109 04/26/2016   CO2 21 04/26/2016   BUN 7 04/26/2016   CREATININE 0.50 04/26/2016     Lipid Panel     Component Value Date/Time   CHOL 109 04/26/2016 1400   TRIG 87 04/26/2016 1400   HDL 45 (L) 04/26/2016 1400   CHOLHDL 2.4 04/26/2016 1400   VLDL 17 04/26/2016 1400   LDLCALC 47 04/26/2016 1400    1) Thyroid uptake and scan from 06/29/2016 showed uniform uptake of 63.9% consistent with Graves' disease.  2) RAI therapy on 07/23/2016.   Assessment & Plan:   1. Hypothyroidism Due to RAI therapy for Graves' disease - She is status post RAI therapy for Graves' disease on 07/23/2016.  - Based on her thyroid function tests, she would benefit from higher dose of levothyroxine. - I discussed and increase her levothyroxine to 150 g by mouth every morning.   - We discussed about correct intake of levothyroxine, at fasting, with water, separated by at least 30 minutes from breakfast, and separated by more than 4 hours from calcium, iron, multivitamins, acid reflux medications (PPIs). -Patient is made aware of the fact that thyroid hormone replacement is needed for life, dose to be adjusted by periodic monitoring  of thyroid function tests. - Given her history of PCO S, I will include A1c with her next labs.  - I advised patient to maintain close follow up with Delton SeeNelson, Letta PateYvonne Sue, MD for primary care needs.  Follow up plan: Return in about 3 months (around 04/25/2017) for follow up with pre-visit labs.  Marquis LunchGebre Darroll Bredeson, MD Phone: [phone]  Fax: [phone]   01/25/2017, 2:03 PM

## 2017-04-20 DIAGNOSIS — E890 Postprocedural hypothyroidism: Secondary | ICD-10-CM | POA: Diagnosis not present

## 2017-04-21 LAB — COMPLETE METABOLIC PANEL WITH GFR
AG Ratio: 1.4 (calc) (ref 1.0–2.5)
ALT: 19 U/L (ref 6–29)
AST: 17 U/L (ref 10–30)
Albumin: 4.4 g/dL (ref 3.6–5.1)
Alkaline phosphatase (APISO): 60 U/L (ref 33–115)
BUN: 9 mg/dL (ref 7–25)
CALCIUM: 9.5 mg/dL (ref 8.6–10.2)
CO2: 26 mmol/L (ref 20–32)
CREATININE: 0.71 mg/dL (ref 0.50–1.10)
Chloride: 104 mmol/L (ref 98–110)
GFR, EST NON AFRICAN AMERICAN: 114 mL/min/{1.73_m2} (ref >=60)
GFR, Est African American: 132 mL/min/{1.73_m2} (ref >=60)
Globulin: 3.2 g/dL (calc) (ref 1.9–3.7)
Glucose, Bld: 73 mg/dL (ref 65–99)
Potassium: 4 mmol/L (ref 3.5–5.3)
Sodium: 140 mmol/L (ref 135–146)
Total Bilirubin: 0.4 mg/dL (ref 0.2–1.2)
Total Protein: 7.6 g/dL (ref 6.1–8.1)

## 2017-04-21 LAB — LIPID PANEL
Cholesterol: 176 mg/dL (ref <200)
HDL: 53 mg/dL (ref >50)
LDL Cholesterol (Calc): 104 mg/dL (calc) — ABNORMAL HIGH
NON-HDL CHOLESTEROL (CALC): 123 mg/dL (ref <130)
TRIGLYCERIDES: 95 mg/dL (ref <150)
Total CHOL/HDL Ratio: 3.3 (calc) (ref <5.0)

## 2017-04-21 LAB — VITAMIN D 25 HYDROXY (VIT D DEFICIENCY, FRACTURES): Vit D, 25-Hydroxy: 10 ng/mL — ABNORMAL LOW (ref 30–100)

## 2017-04-21 LAB — HEMOGLOBIN A1C
EAG (MMOL/L): 5.2 (calc)
Hgb A1c MFr Bld: 4.9 % of total Hgb (ref <5.7)
MEAN PLASMA GLUCOSE: 94 (calc)

## 2017-04-21 LAB — T4, FREE: Free T4: 1.7 ng/dL (ref 0.8–1.8)

## 2017-04-21 LAB — TSH: TSH: 2.56 mIU/L

## 2017-04-27 ENCOUNTER — Encounter: Payer: Self-pay | Admitting: "Endocrinology

## 2017-04-27 ENCOUNTER — Ambulatory Visit (INDEPENDENT_AMBULATORY_CARE_PROVIDER_SITE_OTHER): Payer: BLUE CROSS/BLUE SHIELD | Admitting: "Endocrinology

## 2017-04-27 VITALS — BP 138/85 | HR 93 | Ht 66.0 in | Wt 239.0 lb

## 2017-04-27 DIAGNOSIS — E559 Vitamin D deficiency, unspecified: Secondary | ICD-10-CM

## 2017-04-27 DIAGNOSIS — E890 Postprocedural hypothyroidism: Secondary | ICD-10-CM

## 2017-04-27 MED ORDER — LEVOTHYROXINE SODIUM 150 MCG PO TABS: 150.0000 ug | ORAL_TABLET | Freq: Every day | ORAL | 6 refills | Status: DC

## 2017-04-27 MED ORDER — VITAMIN D3 125 MCG (5000 UT) PO CAPS
5000.0000 [IU] | ORAL_CAPSULE | Freq: Every day | ORAL | 0 refills | Status: DC
Start: 2017-04-27 — End: 2019-06-18

## 2017-04-27 NOTE — Progress Notes (Signed)
Subjective:    Patient ID: Kelly [name], female    DOB: [birth year], PCP Kelly [name], Kelly Sue, MD.   Past Medical History:  Diagnosis Date  . Abrasion of right knee    patient had an abrasion of right knee on arrival to endoscopy  . GERD (gastroesophageal reflux disease)   . Graves disease   . Hypertension   . Hyperthyroidism 06/24/2016  . Situational depression / anxiety 04/26/2016   Past Surgical History:  Procedure Laterality Date  . ANTERIOR CRUCIATE LIGAMENT REPAIR  [id number],2009  . BREAST SURGERY  2008   reconstruction  . COLONOSCOPY N/A 06/04/2016   Procedure: COLONOSCOPY;  Surgeon: West BaliSandi L Fields, MD;  Location: [facility];  Service: Endoscopy;  Laterality: N/A;  115   Social History   Socioeconomic History  . Marital status: Single    Spouse name: None  . Number of children: 0  . Years of education: 4716  . Highest education level: None  Social Needs  . Financial resource strain: None  . Food insecurity - worry: None  . Food insecurity - inability: None  . Transportation needs - medical: None  . Transportation needs - non-medical: None  Occupational History  . Occupation: admin  Tobacco Use  . Smoking status: Never Smoker  . Smokeless tobacco: Never Used  Substance and Sexual Activity  . Alcohol use: No  . Drug use: No  . Sexual activity: Not Currently  Other Topics Concern  . 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 Cholecalciferol (VITAMIN D3) 5000 units CAPS Take 1 capsule (5,000 Units total) by mouth daily.  Marland Kitchen. levothyroxine (SYNTHROID, LEVOTHROID) 150 MCG tablet Take 1 tablet (150 mcg total) by mouth daily before breakfast.  . norethindrone-ethinyl estradiol (MICROGESTIN,JUNEL,LOESTRIN) 1-20 MG-MCG tablet Take 1 tablet by mouth daily.  . [DISCONTINUED] levothyroxine (SYNTHROID,  LEVOTHROID) 150 MCG tablet Take 1 tablet (150 mcg total) by mouth daily before breakfast.   No facility-administered encounter medications on file as of 04/27/2017.    ALLERGIES: No Known Allergies VACCINATION STATUS: Immunization History  Administered Date(s) Administered  . Influenza-Unspecified 12/20/2016    HPI  The patient presents today with a medical history as above.  She is returning in follow-up for hypothyroidism. - She is status post RAI therapy on 07/23/2016, for Graves' disease. - She was initiated on levothyroxine, currently 150 mcg p.o. nightly.  She continues to improve clinically, has no new complaints today.  She is compliant to her medications.   - She has lost 6 pounds since last visit. The patient denies family history of thyroid dysfunction  ,  and  denies personal history of goiter.    Review of Systems Constitutional: +weight loss, - fatigue, - subjective hyperthermia Eyes: no blurry vision, no xerophthalmia ENT: No sore throat, no nodules in her throat, no dysphagia.    Cardiovascular: no Chest Pain, no Shortness of Breath,  - tachycardia,  no leg swelling Respiratory: no cough, no SOB Gastrointestinal: no Nausea/Vomiting/Diarhhea Musculoskeletal: no muscle/joint aches Skin: no rashes, warm and moist skin Neurological: - tremors, no numbness, no tingling, no dizziness Psychiatric: no depression, +anxiety  Objective:    BP 138/85   Pulse 93   Ht 5\' 6"  (1.676 m)   Wt  239 lb (108.4 kg)   BMI 38.58 kg/m   Wt Readings from Last 3 Encounters:  04/27/17 239 lb (108.4 kg)  01/25/17 244 lb (110.7 kg)  11/01/16 231 lb 6.4 oz (105 kg)    Physical Exam   Constitutional: Obese for height, not in acute distress, normal state of mind.   Eyes: PERRLA, EOMI, no exophthalmos ENT: moist mucous membranes, no thyromegaly, no cervical lymphadenopathy Cardiovascular: normal precordial activity, Regular Rate and Rhythm, no Murmur/Rubs/Gallops Respiratory:   adequate breathing efforts, no gross chest deformity, Clear to auscultation bilaterally Gastrointestinal: abdomen soft, Non -tender, No distension, Bowel Sounds present Musculoskeletal: no gross deformities, strength intact in all four extremities Skin: moist, warm, no rashes Neurological: No tremors of outstretched hands.     Results for orders placed or performed in visit on 01/25/17  TSH  Result Value Ref Range   TSH 2.56 mIU/L  T4, free  Result Value Ref Range   Free T4 1.7 0.8 - 1.8 ng/dL  COMPLETE METABOLIC PANEL WITH GFR  Result Value Ref Range   Glucose, Bld 73 65 - 99 mg/dL   BUN 9 7 - 25 mg/dL   Creat 1.61 0.96 - 0.45 mg/dL   GFR, Est Non African American 114 > OR = 60 mL/min/1.10m2   GFR, Est African American 132 > OR = 60 mL/min/1.65m2   BUN/Creatinine Ratio NOT APPLICABLE 6 - 22 (calc)   Sodium 140 135 - 146 mmol/L   Potassium 4.0 3.5 - 5.3 mmol/L   Chloride 104 98 - 110 mmol/L   CO2 26 20 - 32 mmol/L   Calcium 9.5 8.6 - 10.2 mg/dL   Total Protein 7.6 6.1 - 8.1 g/dL   Albumin 4.4 3.6 - 5.1 g/dL   Globulin 3.2 1.9 - 3.7 g/dL (calc)   AG Ratio 1.4 1.0 - 2.5 (calc)   Total Bilirubin 0.4 0.2 - 1.2 mg/dL   Alkaline phosphatase (APISO) 60 33 - 115 U/L   AST 17 10 - 30 U/L   ALT 19 6 - 29 U/L  Hemoglobin A1c  Result Value Ref Range   Hgb A1c MFr Bld 4.9 <5.7 % of total Hgb   Mean Plasma Glucose 94 (calc)   eAG (mmol/L) 5.2 (calc)  VITAMIN D 25 Hydroxy (Vit-D Deficiency, Fractures)  Result Value Ref Range   Vit D, 25-Hydroxy 10 (L) 30 - 100 ng/mL  Lipid panel  Result Value Ref Range   Cholesterol 176 <200 mg/dL   HDL 53 >40 mg/dL   Triglycerides 95 <981 mg/dL   LDL Cholesterol (Calc) 104 (H) mg/dL (calc)   Total CHOL/HDL Ratio 3.3 <5.0 (calc)   Non-HDL Cholesterol (Calc) 123 <130 mg/dL (calc)   Complete Blood Count (Most recent): Lab Results  Component Value Date   WBC 5.9 04/26/2016   HGB 13.4 04/26/2016   HCT 40.0 04/26/2016   MCV 79.4 (L) 04/26/2016    PLT 270 04/26/2016   Chemistry (most recent): Lab Results  Component Value Date   NA 140 04/20/2017   K 4.0 04/20/2017   CL 104 04/20/2017   CO2 26 04/20/2017   BUN 9 04/20/2017   CREATININE 0.71 04/20/2017     Lipid Panel     Component Value Date/Time   CHOL 176 04/20/2017 1211   TRIG 95 04/20/2017 1211   HDL 53 04/20/2017 1211   CHOLHDL 3.3 04/20/2017 1211   VLDL 17 04/26/2016 1400   LDLCALC 47 04/26/2016 1400    1) Thyroid uptake and scan from 06/29/2016  showed uniform uptake of 63.9% consistent with Graves' disease. 2) RAI therapy on 07/23/2016.   Assessment & Plan:   1. Hypothyroidism Due to RAI therapy for Graves' disease - She is status post RAI therapy for Graves' disease on 07/23/2016.  -Her thyroid function tests are indicated for appropriate replacement.  I advised her to continue her levothyroxine at 150 mcg p.o. every morning.     - We discussed about correct intake of levothyroxine, at fasting, with water, separated by at least 30 minutes from breakfast, and separated by more than 4 hours from calcium, iron, multivitamins, acid reflux medications (PPIs). -Patient is made aware of the fact that thyroid hormone replacement is needed for life, dose to be adjusted by periodic monitoring of thyroid function tests.  2.  Vitamin D deficiency: New diagnosis for her -Discussed and initiated vitamin D supplement with vitamin D3 5000 units daily for the next 90 days. -A1c is 4.9% indicative of absence of prediabetes/diabetes.  - I advised patient to maintain close follow up with Delton See Letta Pate, MD for primary care needs.  Follow up plan: Return in about 6 months (around 10/28/2017) for follow up with pre-visit labs.  Marquis Lunch, MD Phone: [phone]  Fax: [phone]  -  This note was partially dictated with voice recognition software. Similar sounding words can be transcribed inadequately or may not  be corrected upon review.  04/27/2017, 4:37 PM

## 2017-05-02 ENCOUNTER — Encounter: Payer: Self-pay | Admitting: Family Medicine

## 2017-05-27 DIAGNOSIS — Z01419 Encounter for gynecological examination (general) (routine) without abnormal findings: Secondary | ICD-10-CM | POA: Diagnosis not present

## 2017-05-27 DIAGNOSIS — Z1389 Encounter for screening for other disorder: Secondary | ICD-10-CM | POA: Diagnosis not present

## 2017-05-27 DIAGNOSIS — Z3041 Encounter for surveillance of contraceptive pills: Secondary | ICD-10-CM | POA: Diagnosis not present

## 2017-05-27 DIAGNOSIS — Z130 Encounter for screening for diseases of the blood and blood-forming organs and certain disorders involving the immune mechanism: Secondary | ICD-10-CM | POA: Diagnosis not present

## 2017-05-27 DIAGNOSIS — Z124 Encounter for screening for malignant neoplasm of cervix: Secondary | ICD-10-CM | POA: Diagnosis not present

## 2017-10-25 DIAGNOSIS — E890 Postprocedural hypothyroidism: Secondary | ICD-10-CM | POA: Diagnosis not present

## 2017-10-25 LAB — T4, FREE: FREE T4: 1.2 ng/dL (ref 0.8–1.8)

## 2017-10-25 LAB — TSH: TSH: 2.64 m[IU]/L

## 2017-10-31 ENCOUNTER — Encounter: Payer: Self-pay | Admitting: "Endocrinology

## 2017-10-31 ENCOUNTER — Ambulatory Visit: Payer: BLUE CROSS/BLUE SHIELD | Admitting: "Endocrinology

## 2017-10-31 VITALS — BP 115/83 | HR 82 | Ht 66.0 in | Wt 250.0 lb

## 2017-10-31 DIAGNOSIS — E890 Postprocedural hypothyroidism: Secondary | ICD-10-CM

## 2017-10-31 MED ORDER — LEVOTHYROXINE SODIUM 150 MCG PO TABS: 150.0000 ug | ORAL_TABLET | Freq: Every day | ORAL | 2 refills | Status: DC

## 2017-10-31 NOTE — Progress Notes (Signed)
Endocrinology follow-up note   Subjective:    Patient ID: Kelly [name], female    DOB: [birth year], PCP Patient, No Pcp Per.   Past Medical History:  Diagnosis Date  . Abrasion of right knee    patient had an abrasion of right knee on arrival to endoscopy  . GERD (gastroesophageal reflux disease)   . Graves disease   . Hypertension   . Hyperthyroidism 06/24/2016  . Situational depression / anxiety 04/26/2016   Past Surgical History:  Procedure Laterality Date  . ANTERIOR CRUCIATE LIGAMENT REPAIR  [id number]  . BREAST SURGERY  2008   reconstruction  . COLONOSCOPY N/A 06/04/2016   Procedure: COLONOSCOPY;  Surgeon: West Bali, MD;  Location: [facility];  Service: Endoscopy;  Laterality: N/A;  115   Social History   Socioeconomic History  . Marital status: Single    Spouse name: Not on file  . Number of children: 0  . Years of education: 71  . Highest education level: Not on file  Occupational History  . Occupation: admin  Social Needs  . Financial resource strain: Not on file  . Food insecurity:    Worry: Not on file    Inability: Not on file  . Transportation needs:    Medical: Not on file    Non-medical: Not on file  Tobacco Use  . Smoking status: Never Smoker  . Smokeless tobacco: Never Used  Substance and Sexual Activity  . Alcohol use: No  . Drug use: No  . Sexual activity: Not Currently  Lifestyle  . Physical activity:    Days per week: Not on file    Minutes per session: Not on file  . Stress: Not on file  Relationships  . Social connections:    Talks on phone: Not on file    Gets together: Not on file    Attends religious service: Not on file    Active member of club or organization: Not on file    Attends meetings of clubs or organizations: Not on file    Relationship status: Not on file  Other Topics Concern  . Not on file  Social History Narrative   Graduated [employer] with degree political science      Works as admin asst      Lives with  parents      2 dogs      Outpatient Encounter Medications as of 10/31/2017  Medication Sig  . Cholecalciferol (VITAMIN D3) 5000 units CAPS Take 1 capsule (5,000 Units total) by mouth daily.  Marland Kitchen levothyroxine (SYNTHROID, LEVOTHROID) 150 MCG tablet Take 1 tablet (150 mcg total) by mouth daily before breakfast.  . norethindrone-ethinyl estradiol (MICROGESTIN,JUNEL,LOESTRIN) 1-20 MG-MCG tablet Take 1 tablet by mouth daily.  . [DISCONTINUED] levothyroxine (SYNTHROID, LEVOTHROID) 150 MCG tablet Take 1 tablet (150 mcg total) by mouth daily before breakfast.   No facility-administered encounter medications on file as of 10/31/2017.    ALLERGIES: No Known Allergies VACCINATION STATUS: Immunization History  Administered Date(s) Administered  . Influenza-Unspecified 12/20/2016    HPI  The patient presents today with a medical history as above.  She is returning in follow-up for hypothyroidism. - She is status post RAI therapy on 07/23/2016, for Graves' disease. - She was initiated on levothyroxine, currently 150 mcg p.o. nightly.  She continues to improve clinically, has no new complaints today.  She is compliant to her medications.   - She has gained weight since last visit. The patient denies family history of thyroid dysfunction  ,  and  denies personal history of goiter.    Review of Systems Constitutional: +weight gain, - fatigue, - subjective hyperthermia Eyes: no blurry vision, no xerophthalmia ENT: No sore throat, no nodules in her throat, no dysphagia.    Cardiovascular: no Chest Pain, no Shortness of Breath,  - tachycardia,  no leg swelling Respiratory: no cough, no SOB Gastrointestinal: no Nausea/Vomiting/Diarhhea Musculoskeletal: no muscle/joint aches Skin: no rashes, warm and moist skin Neurological: - tremors, no numbness, no tingling, no dizziness Psychiatric: no depression, +anxiety  Objective:    BP 115/83   Pulse 82   Ht 5\' 6"  (1.676 m)   Wt 250 lb (113.4 kg)   BMI  40.35 kg/m   Wt Readings from Last 3 Encounters:  10/31/17 250 lb (113.4 kg)  04/27/17 239 lb (108.4 kg)  01/25/17 244 lb (110.7 kg)    Physical Exam   Constitutional: Obese for height, not in acute distress, normal state of mind.   Eyes: PERRLA, EOMI, no exophthalmos ENT: moist mucous membranes, no thyromegaly, no cervical lymphadenopathy Musculoskeletal: no gross deformities, strength intact in all four extremities Skin: moist, warm, no rashes Neurological: No tremors of outstretched hands.     Results for orders placed or performed in visit on 04/27/17  T4, free  Result Value Ref Range   Free T4 1.2 0.8 - 1.8 ng/dL  TSH  Result Value Ref Range   TSH 2.64 mIU/L   Complete Blood Count (Most recent): Lab Results  Component Value Date   WBC 5.9 04/26/2016   HGB 13.4 04/26/2016   HCT 40.0 04/26/2016   MCV 79.4 (L) 04/26/2016   PLT 270 04/26/2016   Chemistry (most recent): Lab Results  Component Value Date   NA 140 04/20/2017   K 4.0 04/20/2017   CL 104 04/20/2017   CO2 26 04/20/2017   BUN 9 04/20/2017   CREATININE 0.71 04/20/2017     Lipid Panel     Component Value Date/Time   CHOL 176 04/20/2017 1211   TRIG 95 04/20/2017 1211   HDL 53 04/20/2017 1211   CHOLHDL 3.3 04/20/2017 1211   VLDL 17 04/26/2016 1400   LDLCALC 104 (H) 04/20/2017 1211    1) Thyroid uptake and scan from 06/29/2016 showed uniform uptake of 63.9% consistent with Graves' disease. 2) RAI therapy on 07/23/2016.   Assessment & Plan:   1. Hypothyroidism Due to RAI therapy for Graves' disease - She is status post RAI therapy for Graves' disease on 07/23/2016.  -Her thyroid function tests are consistent with appropriate replacement.  I advised her to continue levothyroxine at 150 mcg p.o. every morning.    - We discussed about correct intake of levothyroxine, at fasting, with water, separated by at least 30 minutes from breakfast, and separated by more than 4 hours from calcium, iron,  multivitamins, acid reflux medications (PPIs). -Patient is made aware of the fact that thyroid hormone replacement is needed for life, dose to be adjusted by periodic monitoring of thyroid function tests. 2.  Vitamin D deficiency: New diagnosis for her -Continue to benefit from continued supplement with vitamin D3 5000 units daily for another 90 days.    -A1c is 4.9% indicative of absence of prediabetes/diabetes.  - I advised patient to maintain close follow up with Patient, No Pcp Per for primary care needs.  Follow up plan: Return in about 6 months (around 05/01/2018) for Follow up with Pre-visit Labs.  Marquis Lunch, MD Phone: [phone]  Fax: [phone]  -  This note  was partially dictated with voice recognition software. Similar sounding words can be transcribed inadequately or may not  be corrected upon review.  10/31/2017, 1:51 PM

## 2018-05-01 ENCOUNTER — Ambulatory Visit: Payer: BLUE CROSS/BLUE SHIELD | Admitting: "Endocrinology

## 2018-05-04 ENCOUNTER — Other Ambulatory Visit: Payer: Self-pay | Admitting: "Endocrinology

## 2018-05-04 DIAGNOSIS — R739 Hyperglycemia, unspecified: Secondary | ICD-10-CM

## 2018-05-04 DIAGNOSIS — E0590 Thyrotoxicosis, unspecified without thyrotoxic crisis or storm: Secondary | ICD-10-CM

## 2018-05-05 DIAGNOSIS — E0590 Thyrotoxicosis, unspecified without thyrotoxic crisis or storm: Secondary | ICD-10-CM | POA: Diagnosis not present

## 2018-05-05 DIAGNOSIS — R739 Hyperglycemia, unspecified: Secondary | ICD-10-CM | POA: Diagnosis not present

## 2018-05-06 LAB — TSH: TSH: 4.34 mIU/L

## 2018-05-06 LAB — HEMOGLOBIN A1C
Hgb A1c MFr Bld: 5.1 % of total Hgb (ref <5.7)
Mean Plasma Glucose: 100 (calc)
eAG (mmol/L): 5.5 (calc)

## 2018-05-06 LAB — T4, FREE: Free T4: 1.3 ng/dL (ref 0.8–1.8)

## 2018-05-10 ENCOUNTER — Other Ambulatory Visit: Payer: Self-pay

## 2018-05-10 ENCOUNTER — Encounter: Payer: Self-pay | Admitting: "Endocrinology

## 2018-05-10 ENCOUNTER — Ambulatory Visit (INDEPENDENT_AMBULATORY_CARE_PROVIDER_SITE_OTHER): Payer: BLUE CROSS/BLUE SHIELD | Admitting: "Endocrinology

## 2018-05-10 VITALS — BP 113/84 | HR 93 | Ht 66.0 in | Wt 250.0 lb

## 2018-05-10 DIAGNOSIS — E890 Postprocedural hypothyroidism: Secondary | ICD-10-CM | POA: Diagnosis not present

## 2018-05-10 MED ORDER — LEVOTHYROXINE SODIUM 175 MCG PO TABS: 175.0000 ug | ORAL_TABLET | Freq: Every day | ORAL | 6 refills | Status: DC

## 2018-05-10 NOTE — Progress Notes (Signed)
Endocrinology follow-up note   Subjective:    Patient ID: Kelly [name], female    DOB: 01-Mar-1985, PCP Patient, No Pcp Per.   Past Medical History:  Diagnosis Date  . Abrasion of right knee    patient had an abrasion of right knee on arrival to endoscopy  . GERD (gastroesophageal reflux disease)   . Graves disease   . Hypertension   . Hyperthyroidism 06/24/2016  . Situational depression / anxiety 04/26/2016   Past Surgical History:  Procedure Laterality Date  . ANTERIOR CRUCIATE LIGAMENT REPAIR  [id number]  . BREAST SURGERY  2008   reconstruction  . COLONOSCOPY N/A 06/04/2016   Procedure: COLONOSCOPY;  Surgeon: West Bali, MD;  Location: [facility];  Service: Endoscopy;  Laterality: N/A;  115   Social History   Socioeconomic History  . Marital status: Single    Spouse name: Not on file  . Number of children: 0  . Years of education: 68  . Highest education level: Not on file  Occupational History  . Occupation: admin  Social Needs  . Financial resource strain: Not on file  . Food insecurity:    Worry: Not on file    Inability: Not on file  . Transportation needs:    Medical: Not on file    Non-medical: Not on file  Tobacco Use  . Smoking status: Never Smoker  . Smokeless tobacco: Never Used  Substance and Sexual Activity  . Alcohol use: No  . Drug use: No  . Sexual activity: Not Currently  Lifestyle  . Physical activity:    Days per week: Not on file    Minutes per session: Not on file  . Stress: Not on file  Relationships  . Social connections:    Talks on phone: Not on file    Gets together: Not on file    Attends religious service: Not on file    Active member of club or organization: Not on file    Attends meetings of clubs or organizations: Not on file    Relationship status: Not on file  Other Topics Concern  . Not on file  Social History Narrative   Graduated [employer] with degree political science      Works as admin asst      Lives with  parents      2 dogs      Outpatient Encounter Medications as of 05/10/2018  Medication Sig  . Cholecalciferol (VITAMIN D3) 5000 units CAPS Take 1 capsule (5,000 Units total) by mouth daily.  Marland Kitchen levothyroxine (SYNTHROID, LEVOTHROID) 175 MCG tablet Take 1 tablet (175 mcg total) by mouth daily before breakfast.  . norethindrone-ethinyl estradiol (MICROGESTIN,JUNEL,LOESTRIN) 1-20 MG-MCG tablet Take 1 tablet by mouth daily.  . [DISCONTINUED] levothyroxine (SYNTHROID, LEVOTHROID) 150 MCG tablet Take 1 tablet (150 mcg total) by mouth daily before breakfast.   No facility-administered encounter medications on file as of 05/10/2018.    ALLERGIES: No Known Allergies VACCINATION STATUS: Immunization History  Administered Date(s) Administered  . Influenza-Unspecified 12/20/2016    HPI  The patient presents today with a medical history as above.  She is returning in follow-up for RAI induced hypothyroidism. - She is status post RAI therapy on 07/23/2016, for Graves' disease. - She is currently on levothyroxine 150 mcg p.o. daily before breakfast.  She is returning for follow-up with repeat thyroid function test.  She has no new complaints.  - She has steady weight since last visit.  The patient denies family history of  thyroid dysfunction  ,  and  denies personal history of goiter.    Review of Systems Constitutional: + steady weight , - fatigue, - subjective hyperthermia Eyes: no blurry vision, no xerophthalmia ENT: No sore throat, no nodules in her throat, no dysphagia.    Cardiovascular: no Chest Pain, no Shortness of Breath,  - tachycardia,  no leg swelling Respiratory: no cough, no SOB Gastrointestinal: no Nausea/Vomiting/Diarhhea Musculoskeletal: no muscle/joint aches Skin: no rashes, warm and moist skin Neurological: - tremors, no numbness, no tingling, no dizziness Psychiatric: no depression, +anxiety  Objective:    BP 113/84   Pulse 93   Ht  (1.676 m)   Wt 250 lb (113.4  kg)   BMI 40.35 kg/m   Wt Readings from Last 3 Encounters:  05/10/18 250 lb (113.4 kg)  10/31/17 250 lb (113.4 kg)  04/27/17 239 lb (108.4 kg)    Physical Exam   Constitutional: Obese for height, not in acute distress, normal state of mind.   Eyes: PERRLA, EOMI, no exophthalmos ENT: moist mucous membranes, no thyromegaly, no cervical lymphadenopathy Musculoskeletal: no gross deformities, strength intact in all four extremities Skin: moist, warm, no rashes Neurological: No tremors of outstretched hands.     Results for orders placed or performed in visit on 05/04/18  TSH  Result Value Ref Range   TSH 4.34 mIU/L  T4, Free  Result Value Ref Range   Free T4 1.3 0.8 - 1.8 ng/dL  Hemoglobin Z6X  Result Value Ref Range   Hgb A1c MFr Bld 5.1 <5.7 % of total Hgb   Mean Plasma Glucose 100 (calc)   eAG (mmol/L) 5.5 (calc)   Complete Blood Count (Most recent): Lab Results  Component Value Date   WBC 5.9 04/26/2016   HGB 13.4 04/26/2016   HCT 40.0 04/26/2016   MCV 79.4 (L) 04/26/2016   PLT 270 04/26/2016   Chemistry (most recent): Lab Results  Component Value Date   NA 140 04/20/2017   K 4.0 04/20/2017   CL 104 04/20/2017   CO2 26 04/20/2017   BUN 9 04/20/2017   CREATININE 0.71 04/20/2017     Lipid Panel     Component Value Date/Time   CHOL 176 04/20/2017 1211   TRIG 95 04/20/2017 1211   HDL 53 04/20/2017 1211   CHOLHDL 3.3 04/20/2017 1211   VLDL 17 04/26/2016 1400   LDLCALC 104 (H) 04/20/2017 1211    1) Thyroid uptake and scan from 06/29/2016 showed uniform uptake of 63.9% consistent with Graves' disease. 2) RAI therapy on 07/23/2016.   Assessment & Plan:   1. Hypothyroidism Due to RAI therapy for Graves' disease - She is status post RAI therapy for Graves' disease on 07/23/2016.  -Her thyroid function tests are such that she would benefit from slight increase in her levothyroxine dose.  I discussed and increased her levothyroxine to 175 mcg p.o. daily before  breakfast.     - We discussed about the correct intake of her thyroid hormone, on empty stomach at fasting, with water, separated by at least 30 minutes from breakfast and other medications,  and separated by more than 4 hours from calcium, iron, multivitamins, acid reflux medications (PPIs). -Patient is made aware of the fact that thyroid hormone replacement is needed for life, dose to be adjusted by periodic monitoring of thyroid function tests.  2.  Vitamin D deficiency:  -She is status post treatment with  vitamin D3 5000 units daily for  180 days.    -  A1c is 4.9% indicative of absence of prediabetes/diabetes.  - I advised patient to maintain close follow up with Patient, No Pcp Per for primary care needs.  Follow up plan: Return in about 6 months (around 11/10/2018) for Follow up with Pre-visit Labs.  Marquis Lunch, MD Phone: [phone]  Fax: [phone]  -  This note was partially dictated with voice recognition software. Similar sounding words can be transcribed inadequately or may not  be corrected upon review.  05/10/2018, 4:32 PM

## 2018-10-06 DIAGNOSIS — Z01419 Encounter for gynecological examination (general) (routine) without abnormal findings: Secondary | ICD-10-CM | POA: Diagnosis not present

## 2018-10-06 DIAGNOSIS — N920 Excessive and frequent menstruation with regular cycle: Secondary | ICD-10-CM | POA: Diagnosis not present

## 2018-10-06 DIAGNOSIS — Z130 Encounter for screening for diseases of the blood and blood-forming organs and certain disorders involving the immune mechanism: Secondary | ICD-10-CM | POA: Diagnosis not present

## 2018-10-06 DIAGNOSIS — Z1389 Encounter for screening for other disorder: Secondary | ICD-10-CM | POA: Diagnosis not present

## 2018-11-06 DIAGNOSIS — E890 Postprocedural hypothyroidism: Secondary | ICD-10-CM | POA: Diagnosis not present

## 2018-11-06 LAB — T4, FREE: Free T4: 1.5 ng/dL (ref 0.8–1.8)

## 2018-11-06 LAB — TSH: TSH: 0.2 mIU/L — ABNORMAL LOW

## 2018-11-13 ENCOUNTER — Other Ambulatory Visit: Payer: Self-pay

## 2018-11-13 ENCOUNTER — Encounter: Payer: Self-pay | Admitting: "Endocrinology

## 2018-11-13 ENCOUNTER — Ambulatory Visit (INDEPENDENT_AMBULATORY_CARE_PROVIDER_SITE_OTHER): Payer: BC Managed Care – PPO | Admitting: "Endocrinology

## 2018-11-13 DIAGNOSIS — E890 Postprocedural hypothyroidism: Secondary | ICD-10-CM | POA: Diagnosis not present

## 2018-11-13 MED ORDER — LEVOTHYROXINE SODIUM 175 MCG PO TABS: 175.0000 ug | ORAL_TABLET | Freq: Every day | ORAL | 0 refills | Status: DC

## 2018-11-13 NOTE — Progress Notes (Signed)
11/13/2018                                Endocrinology Telehealth Visit Follow up Note -During COVID -19 Pandemic  I connected with Kelly [name] on 11/13/2018   by telephone and verified that I am speaking with the correct person using two identifiers. Kelly [name], 07-27-1985. she has verbally consented to this visit. All issues noted in this document were discussed and addressed. The format was not optimal for physical exam.    Subjective:    Patient ID: Kelly [name], female    DOB: 07-27-1985, PCP Patient, No Pcp Per.   Past Medical History:  Diagnosis Date  . Abrasion of right knee    patient had an abrasion of right knee on arrival to endoscopy  . GERD (gastroesophageal reflux disease)   . Graves disease   . Hypertension   . Hyperthyroidism 06/24/2016  . Situational depression / anxiety 04/26/2016   Past Surgical History:  Procedure Laterality Date  . ANTERIOR CRUCIATE LIGAMENT REPAIR  [id number],2009  . BREAST SURGERY  2008   reconstruction  . COLONOSCOPY N/A 06/04/2016   Procedure: COLONOSCOPY;  Surgeon: West BaliSandi L Fields, MD;  Location: [facility];  Service: Endoscopy;  Laterality: N/A;  115   Social History   Socioeconomic History  . Marital status: Single    Spouse name: Not on file  . Number of children: 0  . Years of education: 1916  . Highest education level: Not on file  Occupational History  . Occupation: admin  Social Needs  . Financial resource strain: Not on file  . Food insecurity    Worry: Not on file    Inability: Not on file  . Transportation needs    Medical: Not on file    Non-medical: Not on file  Tobacco Use  . Smoking status: Never Smoker  . Smokeless tobacco: Never Used  Substance and Sexual Activity  . Alcohol use: No  . Drug use: No  . Sexual activity: Not Currently  Lifestyle  . Physical activity    Days per week: Not on file    Minutes per session: Not on file  . Stress: Not on file  Relationships  . Social Musicianconnections    Talks on  phone: Not on file    Gets together: Not on file    Attends religious service: Not on file    Active member of club or organization: Not on file    Attends meetings of clubs or organizations: Not on file    Relationship status: Not on file  Other Topics Concern  . Not on file  Social History Narrative   Graduated [employer] with degree political science      Works as admin asst      Lives with parents      2 dogs      Outpatient Encounter Medications as of 11/13/2018  Medication Sig  . Cholecalciferol (VITAMIN D3) 5000 units CAPS Take 1 capsule (5,000 Units total) by mouth daily.  Marland Kitchen. levothyroxine (SYNTHROID) 175 MCG tablet Take 1 tablet (175 mcg total) by mouth daily before breakfast.  . norethindrone-ethinyl estradiol (MICROGESTIN,JUNEL,LOESTRIN) 1-20 MG-MCG tablet Take 1 tablet by mouth daily.  . [DISCONTINUED] levothyroxine (SYNTHROID, LEVOTHROID) 175 MCG tablet Take 1 tablet (175 mcg total) by mouth daily before breakfast.   No facility-administered encounter medications on file as of 11/13/2018.    ALLERGIES: No Known Allergies VACCINATION STATUS:  Immunization History  Administered Date(s) Administered  . Influenza-Unspecified 12/20/2016    HPI  The patient presents today with a medical history as above.  She is engaged in telehealth via telephone in follow-up for RAI induced hypothyroidism. - She is status post RAI therapy on 07/23/2016, for Graves' disease. - She is currently on levothyroxine 175  mcg p.o. daily before breakfast.  She continues to feel better.  She has no new complaints.  - She has lost 5 pounds since last visit.  She denies palpitations, tremors, nor heat intolerance.  The patient denies family history of thyroid dysfunction  ,  and  denies personal history of goiter.    Review of Systems Limited as above.  Objective:    There were no vitals taken for this visit.  Wt Readings from Last 3 Encounters:  05/10/18 250 lb (113.4 kg)  10/31/17 250 lb  (113.4 kg)  04/27/17 239 lb (108.4 kg)      Results for orders placed or performed in visit on 05/10/18  TSH  Result Value Ref Range   TSH 0.20 (L) mIU/L  T4, free  Result Value Ref Range   Free T4 1.5 0.8 - 1.8 ng/dL   Complete Blood Count (Most recent): Lab Results  Component Value Date   WBC 5.9 04/26/2016   HGB 13.4 04/26/2016   HCT 40.0 04/26/2016   MCV 79.4 (L) 04/26/2016   PLT 270 04/26/2016   Chemistry (most recent): Lab Results  Component Value Date   NA 140 04/20/2017   K 4.0 04/20/2017   CL 104 04/20/2017   CO2 26 04/20/2017   BUN 9 04/20/2017   CREATININE 0.71 04/20/2017     Lipid Panel     Component Value Date/Time   CHOL 176 04/20/2017 1211   TRIG 95 04/20/2017 1211   HDL 53 04/20/2017 1211   CHOLHDL 3.3 04/20/2017 1211   VLDL 17 04/26/2016 1400   LDLCALC 104 (H) 04/20/2017 1211    1) Thyroid uptake and scan from 06/29/2016 showed uniform uptake of 63.9% consistent with Graves' disease. 2) RAI therapy on 07/23/2016.   Assessment & Plan:   1. Hypothyroidism Due to RAI therapy for Graves' disease - She is status post RAI therapy for Graves' disease on 07/23/2016.  -Her thyroid function tests show slight over replacement, however she will continue to benefit from current dose of her levothyroxine.    -She is advised to continue levothyroxine 175 mcg p.o. daily before breakfast.     - We discussed about the correct intake of her thyroid hormone, on empty stomach at fasting, with water, separated by at least 30 minutes from breakfast and other medications,  and separated by more than 4 hours from calcium, iron, multivitamins, acid reflux medications (PPIs). -Patient is made aware of the fact that thyroid hormone replacement is needed for life, dose to be adjusted by periodic monitoring of thyroid function tests.   Time for this visit: 15 minutes. Kelly [name]  participated in the discussions, expressed understanding, and voiced agreement with the  above plans.  All questions were answered to her satisfaction. she is encouraged to contact clinic should she have any questions or concerns prior to her return visit.   - I advised patient to maintain close follow up with Patient, No Pcp Per for primary care needs.  Follow up plan: Return in about 3 months (around 02/12/2019) for Follow up with Pre-visit Labs.  Marquis Lunch, MD Phone: [phone]  Fax: [phone]  -  This  note was partially dictated with voice recognition software. Similar sounding words can be transcribed inadequately or may not  be corrected upon review.  11/13/2018, 4:17 PM

## 2018-12-16 DIAGNOSIS — Z20828 Contact with and (suspected) exposure to other viral communicable diseases: Secondary | ICD-10-CM | POA: Diagnosis not present

## 2019-02-05 ENCOUNTER — Ambulatory Visit (INDEPENDENT_AMBULATORY_CARE_PROVIDER_SITE_OTHER)
Admission: RE | Admit: 2019-02-05 | Discharge: 2019-02-05 | Disposition: A | Discharge status: Home / Self Care | Payer: BC Managed Care – PPO | Source: Ambulatory Visit

## 2019-02-05 DIAGNOSIS — Z7689 Persons encountering health services in other specified circumstances: Secondary | ICD-10-CM | POA: Diagnosis not present

## 2019-02-05 DIAGNOSIS — Z8619 Personal history of other infectious and parasitic diseases: Secondary | ICD-10-CM | POA: Diagnosis not present

## 2019-02-05 DIAGNOSIS — Z09 Encounter for follow-up examination after completed treatment for conditions other than malignant neoplasm: Secondary | ICD-10-CM | POA: Diagnosis not present

## 2019-02-05 DIAGNOSIS — Z0289 Encounter for other administrative examinations: Secondary | ICD-10-CM | POA: Diagnosis not present

## 2019-02-05 NOTE — Discharge Instructions (Signed)
Safe for you to return to work.

## 2019-02-05 NOTE — ED Provider Notes (Signed)
Virtual Visit via Video Note:  [name]  initiated request for Telemedicine visit with [facility] team. I connected with [name]  on 02/05/2019 at 1:25 PM  for a synchronized telemedicine visit using a video enabled HIPPA compliant telemedicine application. I verified that I am speaking with [name]  using two identifiers. Orvan July, NP  was physically located in a [facility] Urgent care site and Shaquilla Kehres was located at a different location.   The limitations of evaluation and management by telemedicine as well as the availability of in-person appointments were discussed. Patient was informed that she  may incur a bill ( including co-pay) for this virtual visit encounter. Dedria Endres  expressed understanding and gave verbal consent to proceed with virtual visit.     History of Present Illness:Kelly [name]  is a 33 y.o. female presents with needing a note to return to work.  She was diagnosed with Covid on 01/08/2019.  She is currently asymptomatic and is feeling well.  Employer will not let her return without note.   Past Medical History:  Diagnosis Date  . Abrasion of right knee    patient had an abrasion of right knee on arrival to endoscopy  . GERD (gastroesophageal reflux disease)   . Graves disease   . Hypertension   . Hyperthyroidism 06/24/2016  . Situational depression / anxiety 04/26/2016    No Known Allergies      Observations/Objective:VITALS: Per patient if applicable, see vitals. GENERAL: Alert, appears well and in no acute distress. HEENT: Atraumatic, conjunctiva clear, no obvious abnormalities on inspection of external nose and ears. NECK: Normal movements of the head and neck. CARDIOPULMONARY: No increased WOB. Speaking in clear sentences. I:E ratio WNL.  MS: Moves all visible extremities without noticeable abnormality. PSYCH: Pleasant and cooperative, well-groomed. Speech normal rate and rhythm. Affect is appropriate. Insight and  judgement are appropriate. Attention is focused, linear, and appropriate.  NEURO: CN grossly intact. Oriented as arrived to appointment on time with no prompting. Moves both UE equally.  SKIN: No obvious lesions, wounds, erythema, or cyanosis noted on face or hands.     Assessment and Plan: pt appears well and is within the guidelines to return to work. Work note sent to my chart   Follow Up Instructions:    I discussed the assessment and treatment plan with the patient. The patient was provided an opportunity to ask questions and all were answered. The patient agreed with the plan and demonstrated an understanding of the instructions.   The patient was advised to call back or seek an in-person evaluation if the symptoms worsen or if the condition fails to improve as anticipated.    Orvan July, NP  02/05/2019 1:25 PM         Orvan July, NP 02/05/19 1411

## 2019-02-06 DIAGNOSIS — E890 Postprocedural hypothyroidism: Secondary | ICD-10-CM | POA: Diagnosis not present

## 2019-02-06 LAB — TSH: TSH: 0.82 mIU/L

## 2019-02-06 LAB — T4, FREE: Free T4: 1.4 ng/dL (ref 0.8–1.8)

## 2019-02-08 ENCOUNTER — Other Ambulatory Visit: Payer: Self-pay

## 2019-02-08 ENCOUNTER — Ambulatory Visit: Payer: BC Managed Care – PPO | Attending: Internal Medicine

## 2019-02-08 DIAGNOSIS — Z20828 Contact with and (suspected) exposure to other viral communicable diseases: Secondary | ICD-10-CM | POA: Diagnosis not present

## 2019-02-08 DIAGNOSIS — Z20822 Contact with and (suspected) exposure to covid-19: Secondary | ICD-10-CM

## 2019-02-09 LAB — NOVEL CORONAVIRUS, NAA: SARS-CoV-2, NAA: NOT DETECTED

## 2019-02-13 ENCOUNTER — Encounter: Payer: Self-pay | Admitting: "Endocrinology

## 2019-02-13 ENCOUNTER — Ambulatory Visit (INDEPENDENT_AMBULATORY_CARE_PROVIDER_SITE_OTHER): Payer: BC Managed Care – PPO | Admitting: "Endocrinology

## 2019-02-13 DIAGNOSIS — E890 Postprocedural hypothyroidism: Secondary | ICD-10-CM

## 2019-02-13 MED ORDER — LEVOTHYROXINE SODIUM 175 MCG PO TABS: 175.0000 ug | ORAL_TABLET | Freq: Every day | ORAL | 1 refills | Status: DC

## 2019-02-13 NOTE — Progress Notes (Signed)
02/13/2019                                Endocrinology Telehealth Visit Follow up Note -During COVID -19 Pandemic  I connected with Kelly [name] on 02/13/2019   by telephone and verified that I am speaking with the correct person using two identifiers. Kelly [name], [birth year]. she has verbally consented to this visit. All issues noted in this document were discussed and addressed. The format was not optimal for physical exam.    Subjective:    Patient ID: Kelly [name], female    DOB: [birth year], PCP Patient, No Pcp Per.   Past Medical History:  Diagnosis Date  . Abrasion of right knee    patient had an abrasion of right knee on arrival to endoscopy  . GERD (gastroesophageal reflux disease)   . Graves disease   . Hypertension   . Hyperthyroidism 06/24/2016  . Situational depression / anxiety 04/26/2016   Past Surgical History:  Procedure Laterality Date  . ANTERIOR CRUCIATE LIGAMENT REPAIR  [id number]  . BREAST SURGERY  2008   reconstruction  . COLONOSCOPY N/A 06/04/2016   Procedure: COLONOSCOPY;  Surgeon: Danie Binder, MD;  Location: [facility];  Service: Endoscopy;  Laterality: N/A;  115   Social History   Socioeconomic History  . Marital status: Single    Spouse name: Not on file  . Number of children: 0  . Years of education: 4  . Highest education level: Not on file  Occupational History  . Occupation: admin  Tobacco Use  . Smoking status: Never Smoker  . Smokeless tobacco: Never Used  Substance and Sexual Activity  . Alcohol use: No  . Drug use: No  . Sexual activity: Not Currently  Other Topics Concern  . Not on file  Social History Narrative   Graduated [employer] with degree political science      Works as admin asst      Lives with parents      2 dogs      Social Determinants of Health   Financial Resource Strain:   . Difficulty of Paying Living Expenses: Not on file  Food Insecurity:   . Worried About Charity fundraiser in the Last Year: Not on  file  . Ran Out of Food in the Last Year: Not on file  Transportation Needs:   . Lack of Transportation (Medical): Not on file  . Lack of Transportation (Non-Medical): Not on file  Physical Activity:   . Days of Exercise per Week: Not on file  . Minutes of Exercise per Session: Not on file  Stress:   . Feeling of Stress : Not on file  Social Connections:   . Frequency of Communication with Friends and Family: Not on file  . Frequency of Social Gatherings with Friends and Family: Not on file  . Attends Religious Services: Not on file  . Active Member of Clubs or Organizations: Not on file  . Attends Archivist Meetings: Not on file  . Marital Status: Not on file   Outpatient Encounter Medications as of 02/13/2019  Medication Sig  . Cholecalciferol (VITAMIN D3) 5000 units CAPS Take 1 capsule (5,000 Units total) by mouth daily.  Marland Kitchen levothyroxine (SYNTHROID) 175 MCG tablet Take 1 tablet (175 mcg total) by mouth daily before breakfast.  . norethindrone-ethinyl estradiol (MICROGESTIN,JUNEL,LOESTRIN) 1-20 MG-MCG tablet Take 1 tablet by mouth daily.  . [DISCONTINUED] levothyroxine (  SYNTHROID) 175 MCG tablet Take 1 tablet (175 mcg total) by mouth daily before breakfast.   No facility-administered encounter medications on file as of 02/13/2019.   ALLERGIES: No Known Allergies VACCINATION STATUS: Immunization History  Administered Date(s) Administered  . Influenza-Unspecified 12/20/2016    HPI  33 year old female patient who is being engaged in telehealth visit via telephone in follow-up for  RAI induced hypothyroidism. - She is status post RAI therapy on 07/23/2016, for Graves' disease. - She is currently on levothyroxine 175  mcg p.o. daily before breakfast.  She continues to feel better.  She has no new complaints.  She is advised COVID-19 infection.  - She reports that she lost about 5 pounds since last visit.    She denies palpitations, tremors, nor heat intolerance.  The  patient denies family history of thyroid dysfunction  ,  and  denies personal history of goiter.    Review of Systems Limited as above.  Objective:    There were no vitals taken for this visit.  Wt Readings from Last 3 Encounters:  05/10/18 250 lb (113.4 kg)  10/31/17 250 lb (113.4 kg)  04/27/17 239 lb (108.4 kg)      Results for orders placed or performed in visit on 02/08/19  Novel Coronavirus, NAA ([facility])   Specimen: Nasopharyngeal(NP) swabs in vial transport medium   NASOPHARYNGE  TESTING  Result Value Ref Range   SARS-CoV-2, NAA Not Detected Not Detected   Complete Blood Count (Most recent): Lab Results  Component Value Date   WBC 5.9 04/26/2016   HGB 13.4 04/26/2016   HCT 40.0 04/26/2016   MCV 79.4 (L) 04/26/2016   PLT 270 04/26/2016   Chemistry (most recent): Lab Results  Component Value Date   NA 140 04/20/2017   K 4.0 04/20/2017   CL 104 04/20/2017   CO2 26 04/20/2017   BUN 9 04/20/2017   CREATININE 0.71 04/20/2017     Lipid Panel     Component Value Date/Time   CHOL 176 04/20/2017 1211   TRIG 95 04/20/2017 1211   HDL 53 04/20/2017 1211   CHOLHDL 3.3 04/20/2017 1211   VLDL 17 04/26/2016 1400   LDLCALC 104 (H) 04/20/2017 1211   Recent Results (from the past 2160 hour(s))  TSH     Status: None   Collection Time: 02/06/19 11:12 AM  Result Value Ref Range   TSH 0.82 mIU/L    Comment:           Reference Range .           > or = 20 Years  0.40-4.50 .                Pregnancy Ranges           First trimester    0.26-2.66           Second trimester   0.55-2.73           Third trimester    0.43-2.91   T4, free     Status: None   Collection Time: 02/06/19 11:12 AM  Result Value Ref Range   Free T4 1.4 0.8 - 1.8 ng/dL  Novel Coronavirus, NAA ([facility])     Status: None   Collection Time: 02/08/19 12:53 PM   Specimen: Nasopharyngeal(NP) swabs in vial transport medium   NASOPHARYNGE  TESTING  Result Value Ref Range   SARS-CoV-2, NAA Not  Detected Not Detected    Comment: This nucleic acid amplification test was developed and  its performance characteristics determined by [facility]. Nucleic acid amplification tests include PCR and TMA. This test has not been FDA cleared or approved. This test has been authorized by FDA under an Emergency Use Authorization (EUA). This test is only authorized for the duration of time the declaration that circumstances exist justifying the authorization of the emergency use of in vitro diagnostic tests for detection of SARS-CoV-2 virus and/or diagnosis of COVID-19 infection under section 564(b)(1) of the Act, 21 U.S.C. 161WRU-0(A360bbb-3(b) (1), unless the authorization is terminated or revoked sooner. When diagnostic testing is negative, the possibility of a false negative result should be considered in the context of a patient's recent exposures and the presence of clinical signs and symptoms consistent with COVID-19. An individual without symptoms of COVID-19 and who is not shedding SARS-CoV-2 virus would  expect to have a negative (not detected) result in this assay.      1) Thyroid uptake and scan from 06/29/2016 showed uniform uptake of 63.9% consistent with Graves' disease. 2) RAI therapy on 07/23/2016.   Assessment & Plan:   1. Hypothyroidism Due to RAI therapy for Graves' disease - She is status post RAI therapy for Graves' disease on 07/23/2016.  -Her thyroid function tests are consistent with appropriate replacement.  She is advised to continue levothyroxine 175 mcg p.o. daily before breakfast.     - We discussed about the correct intake of her thyroid hormone, on empty stomach at fasting, with water, separated by at least 30 minutes from breakfast and other medications,  and separated by more than 4 hours from calcium, iron, multivitamins, acid reflux medications (PPIs). -Patient is made aware of the fact that thyroid hormone replacement is needed for life, dose to be adjusted  by periodic monitoring of thyroid function tests.  - I advised patient to maintain close follow up with her PMD for primary care needs.    Time for this visit: 15 minutes. Bing ReeMegan Brierley  participated in the discussions, expressed understanding, and voiced agreement with the above plans.  All questions were answered to her satisfaction. she is encouraged to contact clinic should she have any questions or concerns prior to her return visit.  Follow up plan: Return in about 4 months (around 06/14/2019) for Follow up with Pre-visit Labs.  Marquis LunchGebre Nida, MD Phone: [phone]  Fax: [phone]  -  This note was partially dictated with voice recognition software. Similar sounding words can be transcribed inadequately or may not  be corrected upon review.  02/13/2019, 3:53 PM

## 2019-05-14 ENCOUNTER — Ambulatory Visit (INDEPENDENT_AMBULATORY_CARE_PROVIDER_SITE_OTHER)
Admission: RE | Admit: 2019-05-14 | Discharge: 2019-05-14 | Disposition: A | Discharge status: Home / Self Care | Payer: BC Managed Care – PPO | Source: Ambulatory Visit

## 2019-05-14 DIAGNOSIS — S39012A Strain of muscle, fascia and tendon of lower back, initial encounter: Secondary | ICD-10-CM

## 2019-05-14 DIAGNOSIS — M545 Low back pain: Secondary | ICD-10-CM

## 2019-05-14 MED ORDER — NAPROXEN 500 MG PO TABS: 500.0000 mg | ORAL_TABLET | Freq: Two times a day (BID) | ORAL | 0 refills | Status: DC

## 2019-05-14 MED ORDER — CYCLOBENZAPRINE HCL 5 MG PO TABS: 5.0000 mg | ORAL_TABLET | Freq: Two times a day (BID) | ORAL | 0 refills | Status: AC | PRN

## 2019-05-14 NOTE — Discharge Instructions (Addendum)
Recommend RICE: rest, ice, compression, elevation as needed for pain.    Heat therapy (hot compress, warm wash red, hot showers, etc.) can help relax muscles and soothe muscle aches. Cold therapy (ice packs) can be used to help swelling both after injury and after prolonged use of areas of chronic pain/aches.  For pain: naproxen as directed, may add tylenol as needed.  May take muscle relaxer as needed for severe pain / spasm.  (This medication may cause you to become tired so it is important you do not drink alcohol or operate heavy machinery while on this medication.  Recommend your first dose to be taken before bedtime to monitor for side effects safely)  Return for worsening pain, numbness, difficulty walking, fever.

## 2019-05-14 NOTE — ED Provider Notes (Signed)
Virtual Visit via Video Note:  Kelly [name]  initiated request for Telemedicine visit with [facility]gent [facility] team. I connected with Kelly [name]  on 05/14/2019 at 9:39 AM  for a synchronized telemedicine visit using a video enabled HIPPA compliant telemedicine application. I verified that I am speaking with Kelly [name]  using two identifiers. Kelly Hall-Potvin, PA-C  was physically located in a [facility]gent care site and Kelly [name] was located at a different location.   The limitations of evaluation and management by telemedicine as well as the availability of in-person appointments were discussed. Patient was informed that she  may incur a bill ( including co-pay) for this virtual visit encounter. Kelly [name]  expressed understanding and gave verbal consent to proceed with virtual visit.     History of Present Illness:Kelly [name]  is a 34 y.o. female presents with right-sided low back pain since this weekend.  States she was doing lots of yard work and pulled a muscle.  States that she has had this before and feels similar: Last was 10 years ago second to cough from URI.  Patient has tried Aleve without significant relief.  States it is located right side above her hip: No saddle or anesthesia, lower extremity numbness, weakness, urinary or fecal incontinence.  Patient states that pain is worse with certain movements such as straightening her back.  Patient denies fall, trauma to the area.   ROI as per HPI  Past Medical History:  Diagnosis Date  . Abrasion of right knee    patient had an abrasion of right knee on arrival to endoscopy  . GERD (gastroesophageal reflux disease)   . Graves disease   . Hypertension   . Hyperthyroidism 06/24/2016  . Situational depression / anxiety 04/26/2016    No Known Allergies      Observations/Objective: 34 y.o. female Sitting in no acute distress.  Patient is able to speak in full sentences without coughing, sneezing,  wheezing.  Assessment and Plan: H&P concerning for lumbar strain: We will treat supportively as outlined in discharge instructions.  Return precautions discussed, patient verbalized understanding and is agreeable to plan.  Follow Up Instructions: Patient to seek in-person evaluation for persistent/worsening symptoms.   I discussed the assessment and treatment plan with the patient. The patient was provided an opportunity to ask questions and all were answered. The patient agreed with the plan and demonstrated an understanding of the instructions.   The patient was advised to call back or seek an in-person evaluation if the symptoms worsen or if the condition fails to improve as anticipated.  I provided 15 minutes of non-face-to-face time during this encounter.    Kelly Hall-Potvin, PA-C  05/14/2019 9:39 AM        [name], [name], [place] 05/14/19 [phone]

## 2019-05-29 NOTE — Progress Notes (Signed)
REVIEWED-NO ADDITIONAL RECOMMENDATIONS. 

## 2019-06-11 DIAGNOSIS — E890 Postprocedural hypothyroidism: Secondary | ICD-10-CM | POA: Diagnosis not present

## 2019-06-11 LAB — TSH: TSH: 2.08 mIU/L

## 2019-06-11 LAB — T4, FREE: Free T4: 1.5 ng/dL (ref 0.8–1.8)

## 2019-06-18 ENCOUNTER — Other Ambulatory Visit: Payer: Self-pay

## 2019-06-18 ENCOUNTER — Encounter: Payer: Self-pay | Admitting: "Endocrinology

## 2019-06-18 ENCOUNTER — Ambulatory Visit: Payer: BC Managed Care – PPO | Admitting: "Endocrinology

## 2019-06-18 VITALS — BP 129/88 | HR 114 | Ht 66.0 in | Wt 242.4 lb

## 2019-06-18 DIAGNOSIS — E890 Postprocedural hypothyroidism: Secondary | ICD-10-CM

## 2019-06-18 MED ORDER — ATENOLOL 25 MG PO TABS: 25.0000 mg | ORAL_TABLET | Freq: Every day | ORAL | 1 refills | Status: DC

## 2019-06-18 NOTE — Progress Notes (Signed)
06/18/2019           Endocrinology follow-up note   Subjective:    Patient ID: Kelly [name], female    DOB: 21-Aug-1985, PCP Patient, No Pcp Per.   Past Medical History:  Diagnosis Date  . Abrasion of right knee    patient had an abrasion of right knee on arrival to endoscopy  . GERD (gastroesophageal reflux disease)   . Graves disease   . Hypertension   . Hyperthyroidism 06/24/2016  . Situational depression / anxiety 04/26/2016   Past Surgical History:  Procedure Laterality Date  . ANTERIOR CRUCIATE LIGAMENT REPAIR  [id number]  . BREAST SURGERY  2008   reconstruction  . COLONOSCOPY N/A 06/04/2016   Procedure: COLONOSCOPY;  Surgeon: Danie Binder, MD;  Location: [facility];  Service: Endoscopy;  Laterality: N/A;  115   Social History   Socioeconomic History  . Marital status: Single    Spouse name: Not on file  . Number of children: 0  . Years of education: 63  . Highest education level: Not on file  Occupational History  . Occupation: admin  Tobacco Use  . Smoking status: Never Smoker  . Smokeless tobacco: Never Used  Substance and Sexual Activity  . Alcohol use: No  . Drug use: No  . Sexual activity: Not Currently  Other Topics Concern  . Not on file  Social History Narrative   Graduated [employer] with degree political science      Works as admin asst      Lives with parents      2 dogs      Social Determinants of Radio broadcast assistant Strain:   . Difficulty of Paying Living Expenses:   Food Insecurity:   . Worried About Charity fundraiser in the Last Year:   . Arboriculturist in the Last Year:   Transportation Needs:   . Film/video editor (Medical):   Marland Kitchen Lack of Transportation (Non-Medical):   Physical Activity:   . Days of Exercise per Week:   . Minutes of Exercise per Session:   Stress:   . Feeling of Stress :   Social Connections:   . Frequency of Communication with Friends and Family:   . Frequency of Social Gatherings with Friends  and Family:   . Attends Religious Services:   . Active Member of Clubs or Organizations:   . Attends Archivist Meetings:   Marland Kitchen Marital Status:    Outpatient Encounter Medications as of 06/18/2019  Medication Sig  . atenolol (TENORMIN) 25 MG tablet Take 1 tablet (25 mg total) by mouth daily.  . Cholecalciferol (VITAMIN D3) 5000 units CAPS Take 1 capsule (5,000 Units total) by mouth daily.  Marland Kitchen levothyroxine (SYNTHROID) 175 MCG tablet Take 1 tablet (175 mcg total) by mouth daily before breakfast.  . norethindrone-ethinyl estradiol (MICROGESTIN,JUNEL,LOESTRIN) 1-20 MG-MCG tablet Take 1 tablet by mouth daily.  . [DISCONTINUED] naproxen (NAPROSYN) 500 MG tablet Take 1 tablet (500 mg total) by mouth 2 (two) times daily.   No facility-administered encounter medications on file as of 06/18/2019.   ALLERGIES: No Known Allergies VACCINATION STATUS: Immunization History  Administered Date(s) Administered  . Influenza-Unspecified 12/20/2016    HPI  34 year old female patient who is being engaged in telehealth visit via telephone in follow-up for  RAI induced hypothyroidism. - She is status post RAI therapy on 07/23/2016, for Graves' disease. - She is currently on levothyroxine 175 mcg p.o. daily before breakfast.  She continues to feel better.  Her previsit labs are consistent with appropriate replacement.  She has no new complaints today.  She has lost 8 pounds since last visit.   She denies palpitations, tremors, nor heat intolerance.  The patient denies family history of thyroid dysfunction  ,  and  denies personal history of goiter.    Review of Systems   Limited as above.  Objective:    BP 129/88   Pulse (!) 114   Ht 5\' 6"  (1.676 m)   Wt 242 lb 6.4 oz (110 kg)   BMI 39.12 kg/m   Wt Readings from Last 3 Encounters:  06/18/19 242 lb 6.4 oz (110 kg)  05/10/18 250 lb (113.4 kg)  10/31/17 250 lb (113.4 kg)      Results for orders placed or performed in visit on 02/13/19   TSH SOLSTAS  Result Value Ref Range   TSH 2.08 mIU/L  T4, free SOLSTAS  Result Value Ref Range   Free T4 1.5 0.8 - 1.8 ng/dL   Complete Blood Count (Most recent): Lab Results  Component Value Date   WBC 5.9 04/26/2016   HGB 13.4 04/26/2016   HCT 40.0 04/26/2016   MCV 79.4 (L) 04/26/2016   PLT 270 04/26/2016   Chemistry (most recent): Lab Results  Component Value Date   NA 140 04/20/2017   K 4.0 04/20/2017   CL 104 04/20/2017   CO2 26 04/20/2017   BUN 9 04/20/2017   CREATININE 0.71 04/20/2017     Lipid Panel     Component Value Date/Time   CHOL 176 04/20/2017 1211   TRIG 95 04/20/2017 1211   HDL 53 04/20/2017 1211   CHOLHDL 3.3 04/20/2017 1211   VLDL 17 04/26/2016 1400   LDLCALC 104 (H) 04/20/2017 1211   Recent Results (from the past 2160 hour(s))  TSH SOLSTAS     Status: None   Collection Time: 06/11/19 12:26 PM  Result Value Ref Range   TSH 2.08 mIU/L    Comment:           Reference Range .           > or = 20 Years  0.40-4.50 .                Pregnancy Ranges           First trimester    0.26-2.66           Second trimester   0.55-2.73           Third trimester    0.43-2.91   T4, free SOLSTAS     Status: None   Collection Time: 06/11/19 12:26 PM  Result Value Ref Range   Free T4 1.5 0.8 - 1.8 ng/dL     1) Thyroid uptake and scan from 06/29/2016 showed uniform uptake of 63.9% consistent with Graves' disease. 2) RAI therapy on 07/23/2016.   Assessment & Plan:   1. Hypothyroidism Due to RAI therapy for Graves' disease - She is status post RAI therapy for Graves' disease on 07/23/2016.  -Her thyroid function tests are consistent with appropriate replacement she is advised to continue  levothyroxine 175 mcg p.o. daily before breakfast.     - We discussed about the correct intake of her thyroid hormone, on empty stomach at fasting, with water, separated by at least 30 minutes from breakfast and other medications,  and separated by more than 4 hours  from calcium, iron, multivitamins, acid reflux medications (PPIs). -Patient  is made aware of the fact that thyroid hormone replacement is needed for life, dose to be adjusted by periodic monitoring of thyroid function tests.  2. Tachycardia -She had unexplained tachycardia of 114, regular rhythm.  She may benefit from risk therapy with beta-blocker.  I discussed and added atenolol 25 mg p.o. daily for her.  She will need EKG during her annual physical. - I advised patient to maintain close follow up with her PMD for primary care needs.      - Time spent on this patient care encounter:  25 minutes of which 50% was spent in  counseling and the rest reviewing  her current and  previous labs / studies and medications  doses and developing a plan for long term care. Tanner Vigna  participated in the discussions, expressed understanding, and voiced agreement with the above plans.  All questions were answered to her satisfaction. she is encouraged to contact clinic should she have any questions or concerns prior to her return visit.  Follow up plan: Return in about 3 months (around 09/17/2019) for Follow up with Pre-visit Labs.  Marquis Lunch, MD Phone: [phone]  Fax: [phone]  -  This note was partially dictated with voice recognition software. Similar sounding words can be transcribed inadequately or may not  be corrected upon review.  06/18/2019, 5:20 PM

## 2019-09-05 ENCOUNTER — Other Ambulatory Visit: Payer: Self-pay | Admitting: "Endocrinology

## 2019-09-13 DIAGNOSIS — E890 Postprocedural hypothyroidism: Secondary | ICD-10-CM | POA: Diagnosis not present

## 2019-09-13 LAB — TSH: TSH: 1.36 mIU/L

## 2019-09-13 LAB — T4, FREE: Free T4: 1.5 ng/dL (ref 0.8–1.8)

## 2019-09-20 ENCOUNTER — Ambulatory Visit: Payer: BC Managed Care – PPO | Admitting: "Endocrinology

## 2019-09-20 ENCOUNTER — Encounter: Payer: Self-pay | Admitting: "Endocrinology

## 2019-09-20 ENCOUNTER — Other Ambulatory Visit: Payer: Self-pay

## 2019-09-20 VITALS — BP 110/78 | HR 80 | Ht 66.0 in | Wt 246.0 lb

## 2019-09-20 DIAGNOSIS — E890 Postprocedural hypothyroidism: Secondary | ICD-10-CM

## 2019-09-20 MED ORDER — LEVOTHYROXINE SODIUM 175 MCG PO TABS: 175.0000 ug | ORAL_TABLET | Freq: Every day | ORAL | 1 refills | Status: DC

## 2019-09-20 NOTE — Progress Notes (Signed)
09/20/2019           Endocrinology follow-up note   Subjective:    Patient ID: Kelly [name], female    DOB: May 26, 1985, PCP Patient, No Pcp Per.   Past Medical History:  Diagnosis Date  . Abrasion of right knee    patient had an abrasion of right knee on arrival to endoscopy  . GERD (gastroesophageal reflux disease)   . Graves disease   . Hypertension   . Hyperthyroidism 06/24/2016  . Situational depression / anxiety 04/26/2016   Past Surgical History:  Procedure Laterality Date  . ANTERIOR CRUCIATE LIGAMENT REPAIR  [id number]  . BREAST SURGERY  2008   reconstruction  . COLONOSCOPY N/A 06/04/2016   Procedure: COLONOSCOPY;  Surgeon: West Bali, MD;  Location: [facility];  Service: Endoscopy;  Laterality: N/A;  115   Social History   Socioeconomic History  . Marital status: Single    Spouse name: Not on file  . Number of children: 0  . Years of education: 34  . Highest education level: Not on file  Occupational History  . Occupation: admin  Tobacco Use  . Smoking status: Never Smoker  . Smokeless tobacco: Never Used  Vaping Use  . Vaping Use: Never used  Substance and Sexual Activity  . Alcohol use: No  . Drug use: No  . Sexual activity: Not Currently  Other Topics Concern  . Not on file  Social History Narrative   Graduated [employer] with degree political science      Works as admin asst      Lives with parents      2 dogs      Social Determinants of Corporate investment banker Strain:   . Difficulty of Paying Living Expenses:   Food Insecurity:   . Worried About Programme researcher, broadcasting/film/video in the Last Year:   . Barista in the Last Year:   Transportation Needs:   . Freight forwarder (Medical):   Marland Kitchen Lack of Transportation (Non-Medical):   Physical Activity:   . Days of Exercise per Week:   . Minutes of Exercise per Session:   Stress:   . Feeling of Stress :   Social Connections:   . Frequency of Communication with Friends and Family:   .  Frequency of Social Gatherings with Friends and Family:   . Attends Religious Services:   . Active Member of Clubs or Organizations:   . Attends Banker Meetings:   Marland Kitchen Marital Status:    Outpatient Encounter Medications as of 09/20/2019  Medication Sig  . Cholecalciferol (VITAMIN D3) 125 MCG (5000 UT) CAPS Take 1 capsule by mouth daily.  Marland Kitchen atenolol (TENORMIN) 25 MG tablet Take 1 tablet (25 mg total) by mouth daily.  Marland Kitchen levothyroxine (SYNTHROID) 175 MCG tablet Take 1 tablet (175 mcg total) by mouth daily before breakfast.  . norethindrone-ethinyl estradiol (MICROGESTIN,JUNEL,LOESTRIN) 1-20 MG-MCG tablet Take 1 tablet by mouth daily.  . [DISCONTINUED] levothyroxine (SYNTHROID) 175 MCG tablet TAKE 1 TABLET BY MOUTH ONCE DAILY BEFORE BREAKFAST   No facility-administered encounter medications on file as of 09/20/2019.   ALLERGIES: No Known Allergies VACCINATION STATUS: Immunization History  Administered Date(s) Administered  . Influenza-Unspecified 12/20/2016    HPI  34 year old female patient who is being engaged in telehealth visit via telephone in follow-up for  RAI induced hypothyroidism. - She is status post RAI therapy on 07/23/2016, for Graves' disease. - She is currently on levothyroxine 175 mcg  p.o. daily before breakfast.   She continues to feel better.  Her previsit labs are consistent with appropriate replacement.  She has no new complaints today.  She returns with 4 pound weight gain after she lost 8 pounds prior to her last visit.   She was given a low-dose beta-blocker due to palpitations during her last visit, currently she denies palpitations, tremors, nor heat intolerance.  The patient denies family history of thyroid dysfunction  ,  and  denies personal history of goiter.    Review of Systems   Limited as above.  Objective:    BP 110/78   Pulse 80   Ht 5\' 6"  (1.676 m)   Wt (!) 246 lb (111.6 kg)   BMI 39.71 kg/m   Wt Readings from Last 3 Encounters:   09/20/19 (!) 246 lb (111.6 kg)  06/18/19 242 lb 6.4 oz (110 kg)  05/10/18 250 lb (113.4 kg)     Physical Exam- Limited  Constitutional:  Body mass index is 39.71 kg/m. , not in acute distress, normal state of mind Eyes:  EOMI, no exophthalmos Neck: Supple Thyroid: No gross goiter Respiratory: Adequate breathing efforts Musculoskeletal: no gross deformities, strength intact in all four extremities, no gross restriction of joint movements Skin:  no rashes, no hyperemia Neurological: no tremor with outstretched hands   Results for orders placed or performed in visit on 06/18/19  TSH  Result Value Ref Range   TSH 1.36 mIU/L  T4, free  Result Value Ref Range   Free T4 1.5 0.8 - 1.8 ng/dL   Complete Blood Count (Most recent): Lab Results  Component Value Date   WBC 5.9 04/26/2016   HGB 13.4 04/26/2016   HCT 40.0 04/26/2016   MCV 79.4 (L) 04/26/2016   PLT 270 04/26/2016   Chemistry (most recent): Lab Results  Component Value Date   NA 140 04/20/2017   K 4.0 04/20/2017   CL 104 04/20/2017   CO2 26 04/20/2017   BUN 9 04/20/2017   CREATININE 0.71 04/20/2017     Lipid Panel     Component Value Date/Time   CHOL 176 04/20/2017 1211   TRIG 95 04/20/2017 1211   HDL 53 04/20/2017 1211   CHOLHDL 3.3 04/20/2017 1211   VLDL 17 04/26/2016 1400   LDLCALC 104 (H) 04/20/2017 1211   Recent Results (from the past 2160 hour(s))  TSH     Status: None   Collection Time: 09/13/19  9:07 AM  Result Value Ref Range   TSH 1.36 mIU/L    Comment:           Reference Range .           > or = 20 Years  0.40-4.50 .                Pregnancy Ranges           First trimester    0.26-2.66           Second trimester   0.55-2.73           Third trimester    0.43-2.91   T4, free     Status: None   Collection Time: 09/13/19  9:07 AM  Result Value Ref Range   Free T4 1.5 0.8 - 1.8 ng/dL     1) Thyroid uptake and scan from 06/29/2016 showed uniform uptake of 63.9% consistent with  Graves' disease. 2) RAI therapy on 07/23/2016.   Assessment & Plan:   1. Hypothyroidism Due  to RAI therapy for Graves' disease - She is status post RAI therapy for Graves' disease on 07/23/2016.  -Her thyroid function tests are consistent with appropriate replacement she is advised to continue  levothyroxine 175 mcg p.o. daily before breakfast.     - We discussed about the correct intake of her thyroid hormone, on empty stomach at fasting, with water, separated by at least 30 minutes from breakfast and other medications,  and separated by more than 4 hours from calcium, iron, multivitamins, acid reflux medications (PPIs). -Patient is made aware of the fact that thyroid hormone replacement is needed for life, dose to be adjusted by periodic monitoring of thyroid function tests.   2. Tachycardia -She has responded to beta-blocker given for tachycardia.  She is advised to take atenolol 25 mg every other day until she finishes her current supplies.    - I advised patient to maintain close follow up with her PMD for primary care needs.       - Time spent on this patient care encounter:  20 minutes of which 50% was spent in  counseling and the rest reviewing  her current and  previous labs / studies and medications  doses and developing a plan for long term care. Ethie Curless  participated in the discussions, expressed understanding, and voiced agreement with the above plans.  All questions were answered to her satisfaction. she is encouraged to contact clinic should she have any questions or concerns prior to her return visit.   Follow up plan: Return in about 6 months (around 03/22/2020) for F/U with Pre-visit Labs, [place] with [facility].  Marquis Lunch, MD Phone: [phone]  Fax: [phone]  -  This note was partially dictated with voice recognition software. Similar sounding words can be transcribed inadequately or may not  be corrected upon review.  09/20/2019, 6:41 PM

## 2019-10-25 ENCOUNTER — Other Ambulatory Visit: Payer: BC Managed Care – PPO

## 2019-10-25 ENCOUNTER — Other Ambulatory Visit: Payer: Self-pay | Admitting: Critical Care Medicine

## 2019-10-25 DIAGNOSIS — Z20822 Contact with and (suspected) exposure to covid-19: Secondary | ICD-10-CM | POA: Diagnosis not present

## 2019-10-27 LAB — NOVEL CORONAVIRUS, NAA: SARS-CoV-2, NAA: NOT DETECTED

## 2019-11-22 DIAGNOSIS — Z1389 Encounter for screening for other disorder: Secondary | ICD-10-CM | POA: Diagnosis not present

## 2019-11-22 DIAGNOSIS — Z130 Encounter for screening for diseases of the blood and blood-forming organs and certain disorders involving the immune mechanism: Secondary | ICD-10-CM | POA: Diagnosis not present

## 2019-11-22 DIAGNOSIS — Z01419 Encounter for gynecological examination (general) (routine) without abnormal findings: Secondary | ICD-10-CM | POA: Diagnosis not present

## 2019-11-22 DIAGNOSIS — N926 Irregular menstruation, unspecified: Secondary | ICD-10-CM | POA: Diagnosis not present

## 2019-12-13 DIAGNOSIS — Z23 Encounter for immunization: Secondary | ICD-10-CM | POA: Diagnosis not present

## 2020-03-19 DIAGNOSIS — E890 Postprocedural hypothyroidism: Secondary | ICD-10-CM | POA: Diagnosis not present

## 2020-03-20 LAB — T4, FREE: Free T4: 1.88 ng/dL — ABNORMAL HIGH (ref 0.82–1.77)

## 2020-03-20 LAB — TSH: TSH: 2.25 u[IU]/mL (ref 0.450–4.500)

## 2020-03-24 ENCOUNTER — Encounter: Payer: Self-pay | Admitting: Nurse Practitioner

## 2020-03-24 ENCOUNTER — Ambulatory Visit: Payer: BC Managed Care – PPO | Admitting: Nutrition

## 2020-03-24 ENCOUNTER — Other Ambulatory Visit: Payer: Self-pay

## 2020-03-24 ENCOUNTER — Ambulatory Visit: Payer: BC Managed Care – PPO | Admitting: Nurse Practitioner

## 2020-03-24 VITALS — BP 142/86 | HR 100 | Ht 66.0 in | Wt 242.2 lb

## 2020-03-24 DIAGNOSIS — E890 Postprocedural hypothyroidism: Secondary | ICD-10-CM

## 2020-03-24 NOTE — Progress Notes (Signed)
03/24/2020           Endocrinology follow-up note   Subjective:    Patient ID: Kelly [name], female    DOB: 10/31/1985, PCP Patient, No Pcp Per.   Past Medical History:  Diagnosis Date  . Abrasion of right knee    patient had an abrasion of right knee on arrival to endoscopy  . GERD (gastroesophageal reflux disease)   . Graves disease   . Hypertension   . Hyperthyroidism 06/24/2016  . Situational depression / anxiety 04/26/2016   Past Surgical History:  Procedure Laterality Date  . ANTERIOR CRUCIATE LIGAMENT REPAIR  [id number]  . BREAST SURGERY  2008   reconstruction  . COLONOSCOPY N/A 06/04/2016   Procedure: COLONOSCOPY;  Surgeon: West Bali, MD;  Location: [facility];  Service: Endoscopy;  Laterality: N/A;  115   Social History   Socioeconomic History  . Marital status: Single    Spouse name: Not on file  . Number of children: 0  . Years of education: 14  . Highest education level: Not on file  Occupational History  . Occupation: admin  Tobacco Use  . Smoking status: Never Smoker  . Smokeless tobacco: Never Used  Vaping Use  . Vaping Use: Never used  Substance and Sexual Activity  . Alcohol use: No  . Drug use: No  . Sexual activity: Not Currently  Other Topics Concern  . Not on file  Social History Narrative   Graduated [employer] with degree political science      Works as admin asst      Lives with parents      2 dogs      Social Determinants of Health   Financial Resource Strain: Not on file  Food Insecurity: Not on file  Transportation Needs: Not on file  Physical Activity: Not on file  Stress: Not on file  Social Connections: Not on file   Outpatient Encounter Medications as of 03/24/2020  Medication Sig  . Cholecalciferol (VITAMIN D3) 125 MCG (5000 UT) CAPS Take 1 capsule by mouth daily.  Marland Kitchen levothyroxine (SYNTHROID) 175 MCG tablet Take 1 tablet (175 mcg total) by mouth daily before breakfast.  . norethindrone-ethinyl estradiol  (MICROGESTIN,JUNEL,LOESTRIN) 1-20 MG-MCG tablet Take 1 tablet by mouth daily.  . [DISCONTINUED] atenolol (TENORMIN) 25 MG tablet Take 1 tablet (25 mg total) by mouth daily.   No facility-administered encounter medications on file as of 03/24/2020.   ALLERGIES: No Known Allergies VACCINATION STATUS: Immunization History  Administered Date(s) Administered  . Influenza-Unspecified 12/20/2016    HPI Thyroid Problem Presents for follow-up (She is status post RAI therapy on 07/23/2016, for Graves' disease. The patient denies family history of thyroid dysfunction, and denies personal history of goiter.  ) visit. Patient reports no anxiety, cold intolerance, constipation, diarrhea, fatigue, hair loss, heat intolerance, palpitations, tremors, weight gain or weight loss. The symptoms have been stable.    - She is currently on levothyroxine 175 mcg p.o. daily before breakfast.   She continues to feel better.  Her previsit labs are consistent with appropriate replacement.  She has no new complaints today.  Her high heart rate was treated with beta-blocker in the past, which she has now weaned off of completely.  She denies any palpitations.   Review of systems  Constitutional: + Minimally fluctuating body weight,  current Body mass index is 39.09 kg/m. , no fatigue, no subjective hyperthermia, no subjective hypothermia Eyes: no blurry vision, no xerophthalmia ENT: no sore throat, no nodules  palpated in throat, no dysphagia/odynophagia, no hoarseness Cardiovascular: no chest pain, no shortness of breath, no palpitations, no leg swelling Respiratory: no cough, no shortness of breath Gastrointestinal: no nausea/vomiting, intermittent diarrhea Musculoskeletal: no muscle/joint aches Skin: no rashes, no hyperemia Neurological: no tremors, no numbness, no tingling, no dizziness Psychiatric: no depression, fluctuating anxiety, reports increased stress (caregiver for her mother with multiple medical  problems)   Objective:    BP (!) 142/86 (BP Location: Left Arm, Patient Position: Sitting)   Pulse 100   Ht 5\' 6"  (1.676 m)   Wt 242 lb 3.2 oz (109.9 kg)   BMI 39.09 kg/m   Wt Readings from Last 3 Encounters:  03/24/20 242 lb 3.2 oz (109.9 kg)  09/20/19 (!) 246 lb (111.6 kg)  06/18/19 242 lb 6.4 oz (110 kg)     BP Readings from Last 3 Encounters:  03/24/20 (!) 142/86  09/20/19 110/78  06/18/19 129/88     Physical Exam- Limited  Constitutional:  Body mass index is 39.09 kg/m. , not in acute distress, normal state of mind Eyes:  EOMI, no exophthalmos Neck: Supple Cardiovascular: RRR, no murmers, rubs, or gallops, no edema Respiratory: Adequate breathing efforts, no crackles, rales, rhonchi, or wheezing Musculoskeletal: no gross deformities, strength intact in all four extremities, no gross restriction of joint movements Skin:  no rashes, no hyperemia Neurological: no tremor with outstretched hands   Results for orders placed or performed in visit on 10/25/19  Novel Coronavirus, NAA ([facility])   Specimen: Nasopharyngeal(NP) swabs in vial transport medium   Nasopharynge  Screenin  Result Value Ref Range   SARS-CoV-2, NAA Not Detected Not Detected   Complete Blood Count (Most recent): Lab Results  Component Value Date   WBC 5.9 04/26/2016   HGB 13.4 04/26/2016   HCT 40.0 04/26/2016   MCV 79.4 (L) 04/26/2016   PLT 270 04/26/2016   Chemistry (most recent): Lab Results  Component Value Date   NA 140 04/20/2017   K 4.0 04/20/2017   CL 104 04/20/2017   CO2 26 04/20/2017   BUN 9 04/20/2017   CREATININE 0.71 04/20/2017     Lipid Panel     Component Value Date/Time   CHOL 176 04/20/2017 1211   TRIG 95 04/20/2017 1211   HDL 53 04/20/2017 1211   CHOLHDL 3.3 04/20/2017 1211   VLDL 17 04/26/2016 1400   LDLCALC 104 (H) 04/20/2017 1211   Recent Results (from the past 2160 hour(s))  TSH     Status: None   Collection Time: 03/19/20  8:04 AM  Result Value Ref  Range   TSH 2.250 0.450 - 4.500 uIU/mL  T4, free     Status: Abnormal   Collection Time: 03/19/20  8:04 AM  Result Value Ref Range   Free T4 1.88 (H) 0.82 - 1.77 ng/dL     1) Thyroid uptake and scan from 06/29/2016 showed uniform uptake of 63.9% consistent with Graves' disease. 2) RAI therapy on 07/23/2016.   Assessment & Plan:   1. Hypothyroidism Due to RAI therapy for Graves' disease - She is status post RAI therapy for Graves' disease on 07/23/2016.   -Her previsit thyroid function tests are consistent with appropriate hormone replacement.  She is advised to continue Levothyroxine 175 mcg po daily before breakfast.   - We discussed about the correct intake of her thyroid hormone, on empty stomach at fasting, with water, separated by at least 30 minutes from breakfast and other medications,  and separated by more than 4  hours from calcium, iron, multivitamins, acid reflux medications (PPIs). -Patient is made aware of the fact that thyroid hormone replacement is needed for life, dose to be adjusted by periodic monitoring of thyroid function tests.   - I advised patient to maintain close follow up with her PMD for primary care needs.       - Time spent on this patient care encounter:  20 minutes of which 50% was spent in  counseling and the rest reviewing  her current and  previous labs / studies and medications  doses and developing a plan for long term care. Hallel Denherder  participated in the discussions, expressed understanding, and voiced agreement with the above plans.  All questions were answered to her satisfaction. she is encouraged to contact clinic should she have any questions or concerns prior to her return visit.   Follow up plan: Return in about 6 months (around 09/21/2020) for Thyroid follow up, Previsit labs.  Ronny Bacon, [facility] [address] [place], [place] [address] Phone: [phone] Fax: [phone]  03/24/2020,  3:51 PM

## 2020-03-24 NOTE — Patient Instructions (Signed)

## 2020-06-09 ENCOUNTER — Other Ambulatory Visit: Payer: Self-pay | Admitting: "Endocrinology

## 2020-09-18 DIAGNOSIS — E890 Postprocedural hypothyroidism: Secondary | ICD-10-CM | POA: Diagnosis not present

## 2020-09-19 LAB — T4, FREE: Free T4: 1.36 ng/dL (ref 0.82–1.77)

## 2020-09-19 LAB — TSH: TSH: 4.27 u[IU]/mL (ref 0.450–4.500)

## 2020-09-19 NOTE — Patient Instructions (Signed)

## 2020-09-22 ENCOUNTER — Other Ambulatory Visit: Payer: Self-pay

## 2020-09-22 ENCOUNTER — Ambulatory Visit (INDEPENDENT_AMBULATORY_CARE_PROVIDER_SITE_OTHER): Payer: BC Managed Care – PPO | Admitting: Nurse Practitioner

## 2020-09-22 ENCOUNTER — Encounter: Payer: Self-pay | Admitting: Nurse Practitioner

## 2020-09-22 VITALS — BP 135/88 | HR 80 | Ht 66.0 in | Wt 252.0 lb

## 2020-09-22 DIAGNOSIS — E890 Postprocedural hypothyroidism: Secondary | ICD-10-CM

## 2020-09-22 NOTE — Progress Notes (Signed)
09/22/2020           Endocrinology follow-up note   Subjective:    Patient ID: Kelly [name], female    DOB: September 03, 1985, PCP Patient, No Pcp Per (Inactive).   Past Medical History:  Diagnosis Date   Abrasion of right knee    patient had an abrasion of right knee on arrival to endoscopy   GERD (gastroesophageal reflux disease)    Graves disease    Hypertension    Hyperthyroidism 06/24/2016   Situational depression / anxiety 04/26/2016   Past Surgical History:  Procedure Laterality Date   ANTERIOR CRUCIATE LIGAMENT REPAIR  [phone]   BREAST SURGERY  2008   reconstruction   COLONOSCOPY N/A 06/04/2016   Procedure: COLONOSCOPY;  Surgeon: West Bali, MD;  Location: [facility];  Service: Endoscopy;  Laterality: N/A;  115   Social History   Socioeconomic History   Marital status: Single    Spouse name: Not on file   Number of children: 0   Years of education: 16   Highest education level: Not on file  Occupational History   Occupation: admin  Tobacco Use   Smoking status: Never   Smokeless tobacco: Never  Vaping Use   Vaping Use: Never used  Substance and Sexual Activity   Alcohol use: No   Drug use: No   Sexual activity: Not Currently  Other Topics Concern   Not on file  Social History Narrative   Graduated [employer] with degree political science      Works as Corporate treasurer asst      Lives with parents      2 dogs      Social Determinants of Health   Financial Resource Strain: Not on file  Food Insecurity: Not on file  Transportation Needs: Not on file  Physical Activity: Not on file  Stress: Not on file  Social Connections: Not on file   Outpatient Encounter Medications as of 09/22/2020  Medication Sig   Cholecalciferol (VITAMIN D3) 125 MCG (5000 UT) CAPS Take 1 capsule by mouth daily.   levothyroxine (SYNTHROID) 175 MCG tablet TAKE 1 TABLET BY MOUTH ONCE DAILY BEFORE BREAKFAST   norethindrone-ethinyl estradiol (MICROGESTIN,JUNEL,LOESTRIN) 1-20 MG-MCG tablet Take  1 tablet by mouth daily.   No facility-administered encounter medications on file as of 09/22/2020.   ALLERGIES: No Known Allergies VACCINATION STATUS: Immunization History  Administered Date(s) Administered   Influenza-Unspecified 12/20/2016    HPI Thyroid Problem Presents for follow-up (She is status post RAI therapy on 07/23/2016, for Graves' disease. The patient denies family history of thyroid dysfunction, and denies personal history of goiter.  ) visit. Patient reports no anxiety, cold intolerance, constipation, diarrhea, fatigue, hair loss, heat intolerance, palpitations, tremors, weight gain or weight loss. The symptoms have been stable.   - She is currently on Levothyroxine 175 mcg p.o. daily before breakfast.     Review of systems  Constitutional: + Minimally fluctuating body weight,  current Body mass index is 40.67 kg/m. , no fatigue, no subjective hyperthermia, no subjective hypothermia Eyes: no blurry vision, no xerophthalmia ENT: no sore throat, no nodules palpated in throat, no dysphagia/odynophagia, no hoarseness Cardiovascular: no chest pain, no shortness of breath, no palpitations, no leg swelling Respiratory: no cough, no shortness of breath Gastrointestinal: no nausea/vomiting, intermittent diarrhea Musculoskeletal: no muscle/joint aches Skin: no rashes, no hyperemia Neurological: no tremors, no numbness, no tingling, no dizziness Psychiatric: no depression, fluctuating anxiety, reports increased stress (caregiver for her mother with multiple medical problems)  Objective:    BP 135/88   Pulse 80   Ht 5\' 6"  (1.676 m)   Wt 252 lb (114.3 kg)   BMI 40.67 kg/m   Wt Readings from Last 3 Encounters:  09/22/20 252 lb (114.3 kg)  03/24/20 242 lb 3.2 oz (109.9 kg)  09/20/19 (!) 246 lb (111.6 kg)     BP Readings from Last 3 Encounters:  09/22/20 135/88  03/24/20 (!) 142/86  09/20/19 110/78     Physical Exam- Limited  Constitutional:  Body mass index  is 40.67 kg/m. , not in acute distress, normal state of mind Eyes:  EOMI, no exophthalmos Neck: Supple Cardiovascular: RRR, no murmurs, rubs, or gallops, no edema Respiratory: Adequate breathing efforts, no crackles, rales, rhonchi, or wheezing Musculoskeletal: no gross deformities, strength intact in all four extremities, no gross restriction of joint movements Skin:  no rashes, no hyperemia Neurological: no tremor with outstretched hands   Results for orders placed or performed in visit on 03/24/20  TSH  Result Value Ref Range   TSH 4.270 0.450 - 4.500 uIU/mL  T4, free  Result Value Ref Range   Free T4 1.36 0.82 - 1.77 ng/dL   Complete Blood Count (Most recent): Lab Results  Component Value Date   WBC 5.9 04/26/2016   HGB 13.4 04/26/2016   HCT 40.0 04/26/2016   MCV 79.4 (L) 04/26/2016   PLT 270 04/26/2016   Chemistry (most recent): Lab Results  Component Value Date   NA 140 04/20/2017   K 4.0 04/20/2017   CL 104 04/20/2017   CO2 26 04/20/2017   BUN 9 04/20/2017   CREATININE 0.71 04/20/2017     Lipid Panel     Component Value Date/Time   CHOL 176 04/20/2017 1211   TRIG 95 04/20/2017 1211   HDL 53 04/20/2017 1211   CHOLHDL 3.3 04/20/2017 1211   VLDL 17 04/26/2016 1400   LDLCALC 104 (H) 04/20/2017 1211   Recent Results (from the past 2160 hour(s))  TSH     Status: None   Collection Time: 09/18/20 11:39 AM  Result Value Ref Range   TSH 4.270 0.450 - 4.500 uIU/mL  T4, free     Status: None   Collection Time: 09/18/20 11:39 AM  Result Value Ref Range   Free T4 1.36 0.82 - 1.77 ng/dL     1) Thyroid uptake and scan from 06/29/2016 showed uniform uptake of 63.9% consistent with Graves' disease. 2) RAI therapy on 07/23/2016.   Assessment & Plan:   1. Hypothyroidism due to RAI therapy for Graves' disease  - She is status post RAI therapy for Graves' disease on 07/23/2016.   -Her previsit thyroid function tests are consistent with slight under-replacement  (likely associated with recent weight gain). Based on her weight, her total replacement dose should be around 182 mcg.  She is advised to continue Levothyroxine 175 mcg po daily before breakfast, a safer option for her at this time.    - We discussed about the correct intake of her thyroid hormone, on empty stomach at fasting, with water, separated by at least 30 minutes from breakfast and other medications,  and separated by more than 4 hours from calcium, iron, multivitamins, acid reflux medications (PPIs). -Patient is made aware of the fact that thyroid hormone replacement is needed for life, dose to be adjusted by periodic monitoring of thyroid function tests.   - I advised patient to maintain close follow up with her PMD for primary care needs.  I spent 20 minutes in the care of the patient today including review of labs from Thyroid Function, CMP, and other relevant labs ; imaging/biopsy records (current and previous including abstractions from other facilities); face-to-face time discussing  her lab results and symptoms, medications doses, her options of short and long term treatment based on the latest standards of care / guidelines;   and documenting the encounter.  Kelly [name]  participated in the discussions, expressed understanding, and voiced agreement with the above plans.  All questions were answered to her satisfaction. she is encouraged to contact clinic should she have any questions or concerns prior to her return visit.   Follow up plan: Return in about 6 months (around 03/25/2021) for Thyroid follow up, Previsit labs.    Ronny Bacon, [facility] [address] [place], [place] [address] Phone: [phone] Fax: [phone]  09/22/2020, 4:03 PM

## 2020-11-27 DIAGNOSIS — Z124 Encounter for screening for malignant neoplasm of cervix: Secondary | ICD-10-CM | POA: Diagnosis not present

## 2020-11-27 DIAGNOSIS — Z01419 Encounter for gynecological examination (general) (routine) without abnormal findings: Secondary | ICD-10-CM | POA: Diagnosis not present

## 2020-11-27 DIAGNOSIS — Z130 Encounter for screening for diseases of the blood and blood-forming organs and certain disorders involving the immune mechanism: Secondary | ICD-10-CM | POA: Diagnosis not present

## 2020-11-27 DIAGNOSIS — Z1151 Encounter for screening for human papillomavirus (HPV): Secondary | ICD-10-CM | POA: Diagnosis not present

## 2020-12-15 ENCOUNTER — Other Ambulatory Visit: Payer: Self-pay | Admitting: "Endocrinology

## 2021-03-20 DIAGNOSIS — E890 Postprocedural hypothyroidism: Secondary | ICD-10-CM | POA: Diagnosis not present

## 2021-03-21 LAB — TSH: TSH: 4.33 u[IU]/mL (ref 0.450–4.500)

## 2021-03-21 LAB — T4, FREE: Free T4: 1.73 ng/dL (ref 0.82–1.77)

## 2021-03-25 ENCOUNTER — Encounter: Payer: Self-pay | Admitting: Nurse Practitioner

## 2021-03-25 ENCOUNTER — Other Ambulatory Visit: Payer: Self-pay

## 2021-03-25 ENCOUNTER — Ambulatory Visit: Payer: BC Managed Care – PPO | Admitting: Nurse Practitioner

## 2021-03-25 VITALS — BP 117/82 | HR 90 | Ht 66.0 in | Wt 253.4 lb

## 2021-03-25 DIAGNOSIS — E890 Postprocedural hypothyroidism: Secondary | ICD-10-CM | POA: Diagnosis not present

## 2021-03-25 MED ORDER — LEVOTHYROXINE SODIUM 175 MCG PO TABS: 175.0000 ug | ORAL_TABLET | Freq: Every day | ORAL | 3 refills | Status: DC

## 2021-03-25 NOTE — Patient Instructions (Signed)

## 2021-03-25 NOTE — Progress Notes (Signed)
03/25/2021           Endocrinology follow-up note   Subjective:    Patient ID: Kelly [name], female    DOB: 1985-09-07, PCP Patient, No Pcp Per (Inactive).   Past Medical History:  Diagnosis Date   Abrasion of right knee    patient had an abrasion of right knee on arrival to endoscopy   GERD (gastroesophageal reflux disease)    Graves disease    Hypertension    Hyperthyroidism 06/24/2016   Situational depression / anxiety 04/26/2016   Past Surgical History:  Procedure Laterality Date   ANTERIOR CRUCIATE LIGAMENT REPAIR  [id number]   BREAST SURGERY  2008   reconstruction   COLONOSCOPY N/A 06/04/2016   Procedure: COLONOSCOPY;  Surgeon: West Bali, MD;  [facility]: [facility];  Service: Endoscopy;  Laterality: N/A;  115   Social History   Socioeconomic History   Marital status: Single    Spouse name: Not on file   Number of children: 0   Years of education: 16   Highest education level: Not on file  Occupational History   Occupation: admin  Tobacco Use   Smoking status: Never   Smokeless tobacco: Never  Vaping Use   Vaping Use: Never used  Substance and Sexual Activity   Alcohol use: No   Drug use: No   Sexual activity: Not Currently  Other Topics Concern   Not on file  Social History Narrative   Graduated [employer] with degree political science      Works as Corporate treasurer asst      Lives with parents      2 dogs      Social Determinants of Health   Financial Resource Strain: Not on file  Food Insecurity: Not on file  Transportation Needs: Not on file  Physical Activity: Not on file  Stress: Not on file  Social Connections: Not on file   Outpatient Encounter Medications as of 03/25/2021  Medication Sig   Cholecalciferol (VITAMIN D3) 125 MCG (5000 UT) CAPS Take 1 capsule by mouth daily.   loratadine (CLARITIN) 10 MG tablet Take 10 mg by mouth daily as needed for allergies.   norethindrone-ethinyl estradiol (MICROGESTIN,JUNEL,LOESTRIN) 1-20 MG-MCG tablet Take 1  tablet by mouth daily.   [DISCONTINUED] EUTHYROX 175 MCG tablet TAKE 1 TABLET BY MOUTH ONCE DAILY BEFORE BREAKFAST   levothyroxine (EUTHYROX) 175 MCG tablet Take 1 tablet (175 mcg total) by mouth daily before breakfast.   [DISCONTINUED] LARIN FE 1/20 1-20 MG-MCG tablet Take 1 tablet by mouth daily. (Patient not taking: Reported on 03/25/2021)   No facility-administered encounter medications on file as of 03/25/2021.   ALLERGIES: No Known Allergies VACCINATION STATUS: Immunization History  Administered Date(s) Administered   Influenza-Unspecified 12/20/2016    HPI Thyroid Problem Presents for follow-up (She is status post RAI therapy on 07/23/2016, for Graves' disease. The patient denies family history of thyroid dysfunction, and denies personal history of goiter.  ) visit. Patient reports no anxiety, cold intolerance, constipation, diarrhea, fatigue, hair loss, heat intolerance, palpitations, tremors, weight gain or weight loss. The symptoms have been stable.   - She is currently on Levothyroxine 175 mcg p.o. daily before breakfast.     Review of systems  Constitutional: + Minimally fluctuating body weight,  current Body mass index is 40.9 kg/m. , no fatigue, no subjective hyperthermia, no subjective hypothermia Eyes: no blurry vision, no xerophthalmia ENT: no sore throat, no nodules palpated in throat, no dysphagia/odynophagia, no hoarseness Cardiovascular: no chest pain,  no shortness of breath, no palpitations, no leg swelling Respiratory: no cough, no shortness of breath Gastrointestinal: no nausea/vomiting, intermittent diarrhea Musculoskeletal: no muscle/joint aches Skin: no rashes, no hyperemia Neurological: no tremors, no numbness, no tingling, no dizziness Psychiatric: no depression, fluctuating anxiety, reports increased stress (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 , not in acute distress, normal state of mind Eyes:  EOMI, no exophthalmos Neck: Supple Cardiovascular: RRR, no murmurs, rubs, or gallops, no edema Respiratory: Adequate breathing efforts, no crackles, rales, rhonchi, or wheezing Musculoskeletal: no gross deformities, strength intact in all four extremities, no gross restriction of joint movements Skin:  no rashes, no hyperemia Neurological: no tremor with outstretched hands   Results for orders placed or performed in visit on 09/22/20  TSH  Result Value Ref Range   TSH 4.330 0.450 - 4.500 uIU/mL  T4, free  Result Value Ref Range   Free T4 1.73 0.82 - 1.77 ng/dL   Complete Blood Count (Most recent): Lab Results  Component Value Date   WBC 5.9 04/26/2016   HGB 13.4 04/26/2016   HCT 40.0 04/26/2016   MCV 79.4 (L) 04/26/2016   PLT 270 04/26/2016   Chemistry (most recent): Lab Results  Component Value Date   NA 140 04/20/2017   K 4.0 04/20/2017   CL 104 04/20/2017   CO2 26 04/20/2017   BUN 9 04/20/2017   CREATININE 0.71 04/20/2017     Lipid Panel     Component Value Date/Time   CHOL 176 04/20/2017 1211   TRIG 95 04/20/2017 1211   HDL 53 04/20/2017 1211   CHOLHDL 3.3 04/20/2017 1211   VLDL 17 04/26/2016 1400   LDLCALC 104 (H) 04/20/2017 1211   Recent Results (from the past 2160 hour(s))  TSH     Status: None   Collection Time: 03/20/21 11:36 AM  Result Value Ref Range   TSH 4.330 0.450 - 4.500 uIU/mL  T4, free     Status: None   Collection Time: 03/20/21 11:36 AM  Result Value Ref Range   Free T4 1.73 0.82 - 1.77 ng/dL     1) Thyroid uptake and scan from  06/29/2016 showed uniform uptake of 63.9% consistent with Graves' disease. 2) RAI therapy on 07/23/2016.   Assessment & Plan:   1. Hypothyroidism due to RAI therapy for Graves' disease  - She is status post RAI therapy for Graves' disease on 07/23/2016.   -Her previsit thyroid function tests are consistent with appropriate hormone replacement.  She is advised to continue Levothyroxine 175 mcg po daily before breakfast, a safer option for her at this time.    - We discussed about the correct intake of her thyroid hormone, on empty stomach at fasting, with water, separated by at least 30 minutes from breakfast and other medications,  and separated by more than 4 hours from calcium,  iron, multivitamins, acid reflux medications (PPIs). -Patient is made aware of the fact that thyroid hormone replacement is needed for life, dose to be adjusted by periodic monitoring of thyroid function tests.   - I advised patient to maintain close follow up with her PMD for primary care needs.      I spent 20 minutes in the care of the patient today including review of labs from Thyroid Function, CMP, and other relevant labs ; imaging/biopsy records (current and previous including abstractions from other facilities); face-to-face time discussing  her lab results and symptoms, medications doses, her options of short and long term treatment based on the latest standards of care / guidelines;   and documenting the encounter.  Cassi Jenne  participated in the discussions, expressed understanding, and voiced agreement with the above plans.  All questions were answered to her satisfaction. she is encouraged to contact clinic should she have any questions or concerns prior to her return visit.   Follow up plan: Return in about 6 months (around 09/22/2021) for Thyroid follow up, Previsit labs.    Ronny Bacon, [facility] [address] [place], [place] [address] Phone:  [phone] Fax: [phone]  03/25/2021, 4:11 PM

## 2021-09-18 DIAGNOSIS — E890 Postprocedural hypothyroidism: Secondary | ICD-10-CM | POA: Diagnosis not present

## 2021-09-19 LAB — T4, FREE: Free T4: 1.22 ng/dL (ref 0.82–1.77)

## 2021-09-19 LAB — TSH: TSH: 9.47 u[IU]/mL — ABNORMAL HIGH (ref 0.450–4.500)

## 2021-09-21 NOTE — Patient Instructions (Signed)

## 2021-09-22 ENCOUNTER — Ambulatory Visit: Payer: BC Managed Care – PPO | Admitting: Nurse Practitioner

## 2021-09-22 ENCOUNTER — Encounter: Payer: Self-pay | Admitting: Nurse Practitioner

## 2021-09-22 VITALS — BP 134/86 | HR 92 | Ht 66.0 in | Wt 255.0 lb

## 2021-09-22 DIAGNOSIS — E890 Postprocedural hypothyroidism: Secondary | ICD-10-CM | POA: Diagnosis not present

## 2021-09-22 MED ORDER — LEVOTHYROXINE SODIUM 137 MCG PO TABS: 137.0000 ug | ORAL_TABLET | Freq: Every day | ORAL | 1 refills | Status: DC

## 2021-09-22 MED ORDER — LEVOTHYROXINE SODIUM 50 MCG PO TABS: 50.0000 ug | ORAL_TABLET | Freq: Every day | ORAL | 3 refills | Status: DC

## 2021-09-22 NOTE — Progress Notes (Signed)
09/22/2021           Endocrinology follow-up note   Subjective:    Patient ID: Kelly [name], female    DOB: Apr 19, 1985, PCP Patient, No Pcp Per.   Past Medical History:  Diagnosis Date   Abrasion of right knee    patient had an abrasion of right knee on arrival to endoscopy   GERD (gastroesophageal reflux disease)    Graves disease    Hypertension    Hyperthyroidism 06/24/2016   Situational depression / anxiety 04/26/2016   Past Surgical History:  Procedure Laterality Date   ANTERIOR CRUCIATE LIGAMENT REPAIR  [id number]   BREAST SURGERY  2008   reconstruction   COLONOSCOPY N/A 06/04/2016   Procedure: COLONOSCOPY;  Surgeon: West Bali, MD;  Location: [facility];  Service: Endoscopy;  Laterality: N/A;  115   Social History   Socioeconomic History   Marital status: Single    Spouse name: Not on file   Number of children: 0   Years of education: 16   Highest education level: Not on file  Occupational History   Occupation: admin  Tobacco Use   Smoking status: Never   Smokeless tobacco: Never  Vaping Use   Vaping Use: Never used  Substance and Sexual Activity   Alcohol use: No   Drug use: No   Sexual activity: Not Currently  Other Topics Concern   Not on file  Social History Narrative   Graduated [employer] with degree political science      Works as Corporate treasurer asst      Lives with parents      2 dogs      Social Determinants of Health   Financial Resource Strain: Not on file  Food Insecurity: Not on file  Transportation Needs: Not on file  Physical Activity: Not on file  Stress: Not on file  Social Connections: Not on file   Outpatient Encounter Medications as of 09/22/2021  Medication Sig   levothyroxine (SYNTHROID) 50 MCG tablet Take 1 tablet (50 mcg total) by mouth daily.   Cholecalciferol (VITAMIN D3) 125 MCG (5000 UT) CAPS Take 1 capsule by mouth daily.   levothyroxine (EUTHYROX) 137 MCG tablet Take 1 tablet (137 mcg total) by mouth daily before breakfast.    loratadine (CLARITIN) 10 MG tablet Take 10 mg by mouth daily as needed for allergies.   norethindrone-ethinyl estradiol (MICROGESTIN,JUNEL,LOESTRIN) 1-20 MG-MCG tablet Take 1 tablet by mouth daily.   [DISCONTINUED] levothyroxine (EUTHYROX) 175 MCG tablet Take 1 tablet (175 mcg total) by mouth daily before breakfast.   No facility-administered encounter medications on file as of 09/22/2021.   ALLERGIES: No Known Allergies VACCINATION STATUS: Immunization History  Administered Date(s) Administered   Influenza-Unspecified 12/20/2016    HPI Thyroid Problem Presents for follow-up (She is status post RAI therapy on 07/23/2016, for Graves' disease. The patient denies family history of thyroid dysfunction, and denies personal history of goiter.  ) visit. Patient reports no anxiety, cold intolerance, constipation, diarrhea, fatigue, hair loss, heat intolerance, palpitations, tremors, weight gain or weight loss. The symptoms have been stable.    - She is currently on Levothyroxine 175 mcg p.o. daily before breakfast.     Review of systems  Constitutional: + steadily increasing body weight,  current Body mass index is 41.16 kg/m. , + fatigue, no subjective hyperthermia, no subjective hypothermia Eyes: no blurry vision, no xerophthalmia ENT: no sore throat, no nodules palpated in throat, no dysphagia/odynophagia, no hoarseness Cardiovascular: no chest pain, no shortness  of breath, no palpitations, no leg swelling Respiratory: no cough, no shortness of breath Gastrointestinal: no nausea/vomiting Musculoskeletal: no muscle/joint aches Skin: no rashes, no hyperemia Neurological: no tremors, no numbness, no tingling, no dizziness Psychiatric: no depression, fluctuating anxiety   Objective:    BP 134/86   Pulse 92   Ht 5\' 6"  (1.676 m)   Wt 255 lb (115.7 kg)   BMI 41.16 kg/m   Wt Readings from Last 3 Encounters:  09/22/21 255 lb (115.7 kg)  03/25/21 253 lb 6.4 oz (114.9 kg)  09/22/20  252 lb (114.3 kg)     BP Readings from Last 3 Encounters:  09/22/21 134/86  03/25/21 117/82  09/22/20 135/88      Physical Exam- Limited  Constitutional:  Body mass index is 41.16 kg/m. , not in acute distress, normal state of mind Eyes:  EOMI, no exophthalmos Neck: Supple Cardiovascular: RRR, no murmurs, rubs, or gallops, no edema Respiratory: Adequate breathing efforts, no crackles, rales, rhonchi, or wheezing Musculoskeletal: no gross deformities, strength intact in all four extremities, no gross restriction of joint movements Skin:  no rashes, no hyperemia Neurological: no tremor with outstretched hands   Results for orders placed or performed in visit on 03/25/21  TSH  Result Value Ref Range   TSH 9.470 (H) 0.450 - 4.500 uIU/mL  T4, free  Result Value Ref Range   Free T4 1.22 0.82 - 1.77 ng/dL   Complete Blood Count (Most recent): Lab Results  Component Value Date   WBC 5.9 04/26/2016   HGB 13.4 04/26/2016   HCT 40.0 04/26/2016   MCV 79.4 (L) 04/26/2016   PLT 270 04/26/2016   Chemistry (most recent): Lab Results  Component Value Date   NA 140 04/20/2017   K 4.0 04/20/2017   CL 104 04/20/2017   CO2 26 04/20/2017   BUN 9 04/20/2017   CREATININE 0.71 04/20/2017     Lipid Panel     Component Value Date/Time   CHOL 176 04/20/2017 1211   TRIG 95 04/20/2017 1211   HDL 53 04/20/2017 1211   CHOLHDL 3.3 04/20/2017 1211   VLDL 17 04/26/2016 1400   LDLCALC 104 (H) 04/20/2017 1211   Recent Results (from the past 2160 hour(s))  TSH     Status: Abnormal   Collection Time: 09/18/21 10:51 AM  Result Value Ref Range   TSH 9.470 (H) 0.450 - 4.500 uIU/mL  T4, free     Status: None   Collection Time: 09/18/21 10:51 AM  Result Value Ref Range   Free T4 1.22 0.82 - 1.77 ng/dL     1) Thyroid uptake and scan from 06/29/2016 showed uniform uptake of 63.9% consistent with Graves' disease. 2) RAI therapy on 07/23/2016.    Latest Reference Range & Units 09/13/19  09:07 03/19/20 08:04 09/18/20 11:39 03/20/21 11:36 09/18/21 10:51  TSH 0.450 - 4.500 uIU/mL 1.36 2.250 4.270 4.330 9.470 (H)  T4,Free(Direct) 0.82 - 1.77 ng/dL 1.5 09/20/21 (H) 5.09 3.26 1.22  (H): Data is abnormally high  Assessment & Plan:   1. Hypothyroidism due to RAI therapy for Graves' disease  - She is status post RAI therapy for Graves' disease on 07/23/2016.   -Her previsit thyroid function tests are consistent with slight under-replacement.  She notes she did forget to take her medication during recent trip to [place]/22/2016 on some days which may have contributed.  I did increase her Levothyroxine to 187 mcg po daily before breakfast as she does have room for improvement (taking 50  mcg and 137 mcg tabs daily).    - We discussed about the correct intake of her thyroid hormone, on empty stomach at fasting, with water, separated by at least 30 minutes from breakfast and other medications,  and separated by more than 4 hours from calcium, iron, multivitamins, acid reflux medications (PPIs). -Patient is made aware of the fact that thyroid hormone replacement is needed for life, dose to be adjusted by periodic monitoring of thyroid function tests.   - I advised patient to maintain close follow up with her PMD for primary care needs.     I spent 20 minutes in the care of the patient today including review of labs from Thyroid Function, CMP, and other relevant labs ; imaging/biopsy records (current and previous including abstractions from other facilities); face-to-face time discussing  her lab results and symptoms, medications doses, her options of short and long term treatment based on the latest standards of care / guidelines;   and documenting the encounter.  Myesha Stillion  participated in the discussions, expressed understanding, and voiced agreement with the above plans.  All questions were answered to her satisfaction. she is encouraged to contact clinic should she have any questions or concerns  prior to her return visit.   Follow up plan: Return in about 4 months (around 01/22/2022) for Thyroid follow up, Previsit labs, Virtual visit ok.   Ronny Bacon, [facility] [address] [place], [place] [address] Phone: [phone] Fax: [phone]  09/22/2021, 4:42 PM

## 2022-01-22 ENCOUNTER — Telehealth: Payer: BC Managed Care – PPO | Admitting: Nurse Practitioner

## 2022-01-28 DIAGNOSIS — E890 Postprocedural hypothyroidism: Secondary | ICD-10-CM | POA: Diagnosis not present

## 2022-01-29 LAB — TSH: TSH: 7.74 u[IU]/mL — ABNORMAL HIGH (ref 0.450–4.500)

## 2022-01-29 LAB — T4, FREE: Free T4: 1.3 ng/dL (ref 0.82–1.77)

## 2022-01-29 NOTE — Patient Instructions (Signed)

## 2022-02-01 ENCOUNTER — Ambulatory Visit: Payer: BC Managed Care – PPO | Admitting: Nurse Practitioner

## 2022-02-01 ENCOUNTER — Encounter: Payer: Self-pay | Admitting: Nurse Practitioner

## 2022-02-01 DIAGNOSIS — E890 Postprocedural hypothyroidism: Secondary | ICD-10-CM

## 2022-02-01 NOTE — Progress Notes (Signed)
02/01/2022           Endocrinology follow-up note   TELEHEALTH VISIT: The patient is being engaged in telehealth visit.  This type of visit limits physical examination significantly, and thus is not preferable over face-to-face encounters.  I connected with  Kelly [name] on 02/01/22 by a video enabled telemedicine application and verified that I am speaking with the correct person using two identifiers.   I discussed the limitations of evaluation and management by telemedicine. The patient expressed understanding and agreed to proceed.    The participants involved in this visit include: Dani Gobble, NP located at [facility] and Jennye Runquist  located at their personal residence listed.   Subjective:    Patient ID: Kelly [name], female    DOB: 23-Jan-1986, PCP Patient, No Pcp Per.   Past Medical History:  Diagnosis Date   Abrasion of right knee    patient had an abrasion of right knee on arrival to endoscopy   GERD (gastroesophageal reflux disease)    Graves disease    Hypertension    Hyperthyroidism 06/24/2016   Situational depression / anxiety 04/26/2016   Past Surgical History:  Procedure Laterality Date   ANTERIOR CRUCIATE LIGAMENT REPAIR  [id number]   BREAST SURGERY  2008   reconstruction   COLONOSCOPY N/A 06/04/2016   Procedure: COLONOSCOPY;  Surgeon: West Bali, MD;  [facility]: [facility];  Service: Endoscopy;  Laterality: N/A;  115   Social History   Socioeconomic History   Marital status: Single    Spouse name: Not on file   Number of children: 0   Years of education: 16   Highest education level: Not on file  Occupational History   Occupation: admin  Tobacco Use   Smoking status: Never   Smokeless tobacco: Never  Vaping Use   Vaping Use: Never used  Substance and Sexual Activity   Alcohol use: No   Drug use: No   Sexual activity: Not Currently  Other Topics Concern   Not on file  Social History Narrative   Graduated  [employer] with degree political science      Works as Corporate treasurer asst      Lives with parents      2 dogs      Social Determinants of Health   Financial Resource Strain: Not on file  Food Insecurity: Not on file  Transportation Needs: Not on file  Physical Activity: Not on file  Stress: Not on file  Social Connections: Not on file   Outpatient Encounter Medications as of 02/01/2022  Medication Sig   Cholecalciferol (VITAMIN D3) 125 MCG (5000 UT) CAPS Take 1 capsule by mouth daily.   levothyroxine (EUTHYROX) 137 MCG tablet Take 1 tablet (137 mcg total) by mouth daily before breakfast.   levothyroxine (SYNTHROID) 50 MCG tablet Take 1 tablet (50 mcg total) by mouth daily.   loratadine (CLARITIN) 10 MG tablet Take 10 mg by mouth daily as needed for allergies.   norethindrone-ethinyl estradiol (MICROGESTIN,JUNEL,LOESTRIN) 1-20 MG-MCG tablet Take 1 tablet by mouth daily.   No facility-administered encounter medications on file as of 02/01/2022.   ALLERGIES: No Known Allergies VACCINATION STATUS: Immunization History  Administered Date(s) Administered   Influenza-Unspecified 12/20/2016    HPI Thyroid Problem Presents for follow-up (She is status post RAI therapy on 07/23/2016, for Graves' disease. The patient denies family history of thyroid dysfunction, and denies personal history of goiter.  ) visit. Patient reports no anxiety, cold intolerance, constipation, diarrhea, fatigue,  hair loss, heat intolerance, palpitations, tremors, weight gain or weight loss. The symptoms have been stable.    - She is currently on Levothyroxine 175 mcg p.o. daily before breakfast.     Review of systems  Constitutional: + Minimally fluctuating body weight,  current There is no height or weight on file to calculate BMI. , no fatigue, no subjective hyperthermia, no subjective hypothermia Eyes: no blurry vision, no xerophthalmia ENT: no sore throat, no nodules palpated in throat, no dysphagia/odynophagia, no  hoarseness Cardiovascular: no chest pain, no shortness of breath, no palpitations, no leg swelling Respiratory: no cough, no shortness of breath Gastrointestinal: no nausea/vomiting/diarrhea Musculoskeletal: no muscle/joint aches Skin: no rashes, no hyperemia Neurological: no tremors, no numbness, no tingling, no dizziness Psychiatric: no depression, no anxiety   Objective:    There were no vitals taken for this visit.  Wt Readings from Last 3 Encounters:  09/22/21 255 lb (115.7 kg)  03/25/21 253 lb 6.4 oz (114.9 kg)  09/22/20 252 lb (114.3 kg)     BP Readings from Last 3 Encounters:  09/22/21 134/86  03/25/21 117/82  09/22/20 135/88   Physical Exam- Telehealth- significantly limited due to nature of visit  Constitutional: There is no height or weight on file to calculate BMI. , not in acute distress, normal state of mind Respiratory: Adequate breathing efforts   Results for orders placed or performed in visit on 09/22/21  TSH  Result Value Ref Range   TSH 7.740 (H) 0.450 - 4.500 uIU/mL  T4, free  Result Value Ref Range   Free T4 1.30 0.82 - 1.77 ng/dL   Complete Blood Count (Most recent): Lab Results  Component Value Date   WBC 5.9 04/26/2016   HGB 13.4 04/26/2016   HCT 40.0 04/26/2016   MCV 79.4 (L) 04/26/2016   PLT 270 04/26/2016   Chemistry (most recent): Lab Results  Component Value Date   NA 140 04/20/2017   K 4.0 04/20/2017   CL 104 04/20/2017   CO2 26 04/20/2017   BUN 9 04/20/2017   CREATININE 0.71 04/20/2017     Lipid Panel     Component Value Date/Time   CHOL 176 04/20/2017 1211   TRIG 95 04/20/2017 1211   HDL 53 04/20/2017 1211   CHOLHDL 3.3 04/20/2017 1211   VLDL 17 04/26/2016 1400   LDLCALC 104 (H) 04/20/2017 1211   Recent Results (from the past 2160 hour(s))  TSH     Status: Abnormal   Collection Time: 01/28/22  9:28 AM  Result Value Ref Range   TSH 7.740 (H) 0.450 - 4.500 uIU/mL  T4, free     Status: None   Collection Time:  01/28/22  9:28 AM  Result Value Ref Range   Free T4 1.30 0.82 - 1.77 ng/dL     1) Thyroid uptake and scan from 06/29/2016 showed uniform uptake of 63.9% consistent with Graves' disease. 2) RAI therapy on 07/23/2016.    Latest Reference Range & Units 09/13/19 09:07 03/19/20 08:04 09/18/20 11:39 03/20/21 11:36 09/18/21 10:51 01/28/22 09:28  TSH 0.450 - 4.500 uIU/mL 1.36 2.250 4.270 4.330 9.470 (H) 7.740 (H)  T4,Free(Direct) 0.82 - 1.77 ng/dL 1.5 3.55 (H) 7.32 2.02 1.22 1.30  (H): Data is abnormally high  Assessment & Plan:   1. Hypothyroidism due to RAI therapy for Graves' disease  - She is status post RAI therapy for Graves' disease on 07/23/2016.   -Her previsit thyroid function tests are consistent with slight under-replacement.  She does note that  she takes her thyroid medication along with her vitamin D supplement in the morning which is likely affecting absorption.  She is advised to continue her Levothyroxine 87 mcg po daily before breakfast as she does have room for improvement (taking 50 mcg and 137 mcg tabs daily).    - We discussed about the correct intake of her thyroid hormone, on empty stomach at fasting, with water, separated by at least 30 minutes from breakfast and other medications,  and separated by more than 4 hours from calcium, iron, multivitamins, acid reflux medications (PPIs). -Patient is made aware of the fact that thyroid hormone replacement is needed for life, dose to be adjusted by periodic monitoring of thyroid function tests.   - I advised patient to maintain close follow up with her PMD for primary care needs.      I spent 20 dedicated to the care of this patient on the date of this encounter to include pre-visit review of records, face-to-face time with the patient, and post visit ordering of testing.  Kelly [name]  participated in the discussions, expressed understanding, and voiced agreement with the above plans.  All questions were answered to her  satisfaction. she is encouraged to contact clinic should she have any questions or concerns prior to her return visit.   Follow up plan: Return in about 2 months (around 04/04/2022) for Thyroid follow up, Virtual visit ok, Previsit labs.   Ronny Bacon, [facility] [address] [place], [place] [address] Phone: [phone] Fax: [phone]  02/01/2022, 3:31 PM

## 2022-03-04 DIAGNOSIS — Z01419 Encounter for gynecological examination (general) (routine) without abnormal findings: Secondary | ICD-10-CM | POA: Diagnosis not present

## 2022-03-04 DIAGNOSIS — Z130 Encounter for screening for diseases of the blood and blood-forming organs and certain disorders involving the immune mechanism: Secondary | ICD-10-CM | POA: Diagnosis not present

## 2022-04-02 DIAGNOSIS — E890 Postprocedural hypothyroidism: Secondary | ICD-10-CM | POA: Diagnosis not present

## 2022-04-03 LAB — T4, FREE: Free T4: 1.53 ng/dL (ref 0.82–1.77)

## 2022-04-03 LAB — TSH: TSH: 2.24 u[IU]/mL (ref 0.450–4.500)

## 2022-04-06 ENCOUNTER — Encounter: Payer: Self-pay | Admitting: Nurse Practitioner

## 2022-04-06 ENCOUNTER — Ambulatory Visit: Payer: BC Managed Care – PPO | Admitting: Nurse Practitioner

## 2022-04-06 VITALS — BP 132/82 | HR 97 | Ht 66.0 in | Wt 251.4 lb

## 2022-04-06 DIAGNOSIS — E890 Postprocedural hypothyroidism: Secondary | ICD-10-CM

## 2022-04-06 MED ORDER — LEVOTHYROXINE SODIUM 137 MCG PO TABS: 137.0000 ug | ORAL_TABLET | Freq: Every day | ORAL | 3 refills | Status: DC

## 2022-04-06 MED ORDER — LEVOTHYROXINE SODIUM 50 MCG PO TABS: 50.0000 ug | ORAL_TABLET | Freq: Every day | ORAL | 3 refills | Status: DC

## 2022-04-06 NOTE — Patient Instructions (Signed)

## 2022-04-06 NOTE — Progress Notes (Signed)
04/06/2022           Endocrinology follow-up note    Subjective:    Patient ID: Kelly [name], female    DOB: 1985/08/30, PCP Patient, No Pcp Per.   Past Medical History:  Diagnosis Date   Abrasion of right knee    patient had an abrasion of right knee on arrival to endoscopy   GERD (gastroesophageal reflux disease)    Graves disease    Hypertension    Hyperthyroidism 06/24/2016   Situational depression / anxiety 04/26/2016   Past Surgical History:  Procedure Laterality Date   ANTERIOR CRUCIATE LIGAMENT REPAIR  [id number]   BREAST SURGERY  2008   reconstruction   COLONOSCOPY N/A 06/04/2016   Procedure: COLONOSCOPY;  Surgeon: Danie Binder, MD;  Location: [facility];  Service: Endoscopy;  Laterality: N/A;  115   Social History   Socioeconomic History   Marital status: Single    Spouse name: Not on file   Number of children: 0   Years of education: 16   Highest education level: Not on file  Occupational History   Occupation: admin  Tobacco Use   Smoking status: Never   Smokeless tobacco: Never  Vaping Use   Vaping Use: Never used  Substance and Sexual Activity   Alcohol use: No   Drug use: No   Sexual activity: Not Currently  Other Topics Concern   Not on file  Social History Narrative   Graduated [employer] with degree political science      Works as Estate agent asst      Lives with parents      2 dogs      Social Determinants of Health   Financial Resource Strain: Not on file  Food Insecurity: Not on file  Transportation Needs: Not on file  Physical Activity: Not on file  Stress: Not on file  Social Connections: Not on file   Outpatient Encounter Medications as of 04/06/2022  Medication Sig   Cholecalciferol (VITAMIN D3) 125 MCG (5000 UT) CAPS Take 1 capsule by mouth daily.   loratadine (CLARITIN) 10 MG tablet Take 10 mg by mouth daily as needed for allergies.   norethindrone-ethinyl estradiol (MICROGESTIN,JUNEL,LOESTRIN) 1-20 MG-MCG tablet Take 1 tablet by  mouth daily.   [DISCONTINUED] levothyroxine (EUTHYROX) 137 MCG tablet Take 1 tablet (137 mcg total) by mouth daily before breakfast.   [DISCONTINUED] levothyroxine (SYNTHROID) 50 MCG tablet Take 1 tablet (50 mcg total) by mouth daily.   levothyroxine (EUTHYROX) 137 MCG tablet Take 1 tablet (137 mcg total) by mouth daily before breakfast.   levothyroxine (SYNTHROID) 50 MCG tablet Take 1 tablet (50 mcg total) by mouth daily.   No facility-administered encounter medications on file as of 04/06/2022.   ALLERGIES: No Known Allergies VACCINATION STATUS: Immunization History  Administered Date(s) Administered   Influenza-Unspecified 12/20/2016    HPI Thyroid Problem Presents for follow-up (She is status post RAI therapy on 07/23/2016, for Graves' disease. The patient denies family history of thyroid dysfunction, and denies personal history of goiter.  ) visit. Patient reports no anxiety, cold intolerance, constipation, diarrhea, fatigue, hair loss, heat intolerance, palpitations, tremors, weight gain or weight loss. The symptoms have been improving.    - She is currently on Levothyroxine 187 mcg p.o. daily before breakfast.     Review of systems  Constitutional: + Minimally fluctuating body weight,  current Body mass index is 40.58 kg/m. , no fatigue, no subjective hyperthermia, no subjective hypothermia Eyes: no blurry vision, no xerophthalmia ENT:  no sore throat, no nodules palpated in throat, no dysphagia/odynophagia, no hoarseness Cardiovascular: no chest pain, no shortness of breath, no palpitations, no leg swelling Respiratory: no cough, no shortness of breath Gastrointestinal: no nausea/vomiting/diarrhea Musculoskeletal: no muscle/joint aches Skin: no rashes, no hyperemia Neurological: no tremors, no numbness, no tingling, no dizziness Psychiatric: no depression, no anxiety   Objective:    BP 132/82 (BP Location: Right Arm, Patient Position: Sitting, Cuff Size: Large) Comment:  Retake Manuel Cuff  Pulse 97   Ht 5' 6"$  (1.676 m)   Wt 251 lb 6.4 oz (114 kg)   BMI 40.58 kg/m   Wt Readings from Last 3 Encounters:  04/06/22 251 lb 6.4 oz (114 kg)  09/22/21 255 lb (115.7 kg)  03/25/21 253 lb 6.4 oz (114.9 kg)     BP Readings from Last 3 Encounters:  04/06/22 132/82  09/22/21 134/86  03/25/21 117/82    Physical Exam- Limited  Constitutional:  Body mass index is 40.58 kg/m. , not in acute distress, normal state of mind Eyes:  EOMI, no exophthalmos Musculoskeletal: no gross deformities, strength intact in all four extremities, no gross restriction of joint movements Skin:  no rashes, no hyperemia Neurological: no tremor with outstretched hands   Results for orders placed or performed in visit on 02/01/22  TSH  Result Value Ref Range   TSH 2.240 0.450 - 4.500 uIU/mL  T4, free  Result Value Ref Range   Free T4 1.53 0.82 - 1.77 ng/dL   Complete Blood Count (Most recent): Lab Results  Component Value Date   WBC 5.9 04/26/2016   HGB 13.4 04/26/2016   HCT 40.0 04/26/2016   MCV 79.4 (L) 04/26/2016   PLT 270 04/26/2016   Chemistry (most recent): Lab Results  Component Value Date   NA 140 04/20/2017   K 4.0 04/20/2017   CL 104 04/20/2017   CO2 26 04/20/2017   BUN 9 04/20/2017   CREATININE 0.71 04/20/2017     Lipid Panel     Component Value Date/Time   CHOL 176 04/20/2017 1211   TRIG 95 04/20/2017 1211   HDL 53 04/20/2017 1211   CHOLHDL 3.3 04/20/2017 1211   VLDL 17 04/26/2016 1400   LDLCALC 104 (H) 04/20/2017 1211   Recent Results (from the past 2160 hour(s))  TSH     Status: Abnormal   Collection Time: 01/28/22  9:28 AM  Result Value Ref Range   TSH 7.740 (H) 0.450 - 4.500 uIU/mL  T4, free     Status: None   Collection Time: 01/28/22  9:28 AM  Result Value Ref Range   Free T4 1.30 0.82 - 1.77 ng/dL  TSH     Status: None   Collection Time: 04/02/22 11:37 AM  Result Value Ref Range   TSH 2.240 0.450 - 4.500 uIU/mL  T4, free      Status: None   Collection Time: 04/02/22 11:37 AM  Result Value Ref Range   Free T4 1.53 0.82 - 1.77 ng/dL     1) Thyroid uptake and scan from 06/29/2016 showed uniform uptake of 63.9% consistent with Graves' disease. 2) RAI therapy on 07/23/2016.    Latest Reference Range & Units 03/20/21 11:36 09/18/21 10:51 01/28/22 09:28 04/02/22 11:37  TSH 0.450 - 4.500 uIU/mL 4.330 9.470 (H) 7.740 (H) 2.240  T4,Free(Direct) 0.82 - 1.77 ng/dL 1.73 1.22 1.30 1.53  (H): Data is abnormally high  Assessment & Plan:   1. Hypothyroidism due to RAI therapy for Graves' disease  -  She is status post RAI therapy for Graves' disease on 07/23/2016.   -Her previsit thyroid function tests are consistent with appropriate hormone replacement.   She is advised to continue her Levothyroxine 87 mcg po daily before breakfast as she does have room for improvement (taking 50 mcg and 137 mcg tabs daily).    - We discussed about the correct intake of her thyroid hormone, on empty stomach at fasting, with water, separated by at least 30 minutes from breakfast and other medications,  and separated by more than 4 hours from calcium, iron, multivitamins, acid reflux medications (PPIs). -Patient is made aware of the fact that thyroid hormone replacement is needed for life, dose to be adjusted by periodic monitoring of thyroid function tests.   - I advised patient to maintain close follow up with her PMD for primary care needs.      I spent  16  minutes in the care of the patient today including review of labs from Thyroid Function, CMP, and other relevant labs ; imaging/biopsy records (current and previous including abstractions from other facilities); face-to-face time discussing  her lab results and symptoms, medications doses, her options of short and long term treatment based on the latest standards of care / guidelines;   and documenting the encounter.  Kelly [name]  participated in the discussions, expressed  understanding, and voiced agreement with the above plans.  All questions were answered to her satisfaction. she is encouraged to contact clinic should she have any questions or concerns prior to her return visit.   Follow up plan: Return in about 6 months (around 10/05/2022) for Thyroid follow up, Previsit labs.   Rayetta Pigg, [facility] [address] [place], [place] [address] Phone: [phone] Fax: [phone]  04/06/2022, 3:04 PM

## 2022-10-01 DIAGNOSIS — E890 Postprocedural hypothyroidism: Secondary | ICD-10-CM | POA: Diagnosis not present

## 2022-10-04 NOTE — Patient Instructions (Signed)

## 2022-10-05 ENCOUNTER — Encounter: Payer: Self-pay | Admitting: Nurse Practitioner

## 2022-10-05 ENCOUNTER — Ambulatory Visit: Payer: BC Managed Care – PPO | Admitting: Nurse Practitioner

## 2022-10-05 VITALS — BP 124/85 | HR 80 | Ht 66.0 in | Wt 249.9 lb

## 2022-10-05 DIAGNOSIS — E890 Postprocedural hypothyroidism: Secondary | ICD-10-CM

## 2022-10-05 NOTE — Progress Notes (Signed)
10/05/2022           Endocrinology follow-up note    Subjective:    Patient ID: Kelly [name], female    DOB: [birth year], PCP Patient, No Pcp Per.   Past Medical History:  Diagnosis Date   Abrasion of right knee    patient had an abrasion of right knee on arrival to endoscopy   GERD (gastroesophageal reflux disease)    Graves disease    Hypertension    Hyperthyroidism 06/24/2016   Situational depression / anxiety 04/26/2016   Past Surgical History:  Procedure Laterality Date   ANTERIOR CRUCIATE LIGAMENT REPAIR  [id number]   BREAST SURGERY  2008   reconstruction   COLONOSCOPY N/A 06/04/2016   Procedure: COLONOSCOPY;  Surgeon: West Bali, MD;  Location: [facility];  Service: Endoscopy;  Laterality: N/A;  115   Social History   Socioeconomic History   Marital status: Single    Spouse name: Not on file   Number of children: 0   Years of education: 16   Highest education level: Not on file  Occupational History   Occupation: admin  Tobacco Use   Smoking status: Never   Smokeless tobacco: Never  Vaping Use   Vaping status: Never Used  Substance and Sexual Activity   Alcohol use: No   Drug use: No   Sexual activity: Not Currently  Other Topics Concern   Not on file  Social History Narrative   Graduated [employer] with degree political science      Works as Corporate treasurer asst      Lives with parents      2 dogs      Social Determinants of Health   Financial Resource Strain: Not on file  Food Insecurity: Not on file  Transportation Needs: Not on file  Physical Activity: Not on file  Stress: Not on file  Social Connections: Not on file   Outpatient Encounter Medications as of 10/05/2022  Medication Sig   Cholecalciferol (VITAMIN D3) 125 MCG (5000 UT) CAPS Take 1 capsule by mouth daily.   levothyroxine (EUTHYROX) 137 MCG tablet Take 1 tablet (137 mcg total) by mouth daily before breakfast.   levothyroxine (SYNTHROID) 50 MCG tablet Take 1 tablet (50 mcg total) by mouth  daily.   loratadine (CLARITIN) 10 MG tablet Take 10 mg by mouth daily as needed for allergies.   norethindrone-ethinyl estradiol (MICROGESTIN,JUNEL,LOESTRIN) 1-20 MG-MCG tablet Take 1 tablet by mouth daily.   No facility-administered encounter medications on file as of 10/05/2022.   ALLERGIES: No Known Allergies VACCINATION STATUS: Immunization History  Administered Date(s) Administered   Influenza-Unspecified 12/20/2016    HPI Thyroid Problem Presents for follow-up (She is status post RAI therapy on 07/23/2016, for Graves' disease. The patient denies family history of thyroid dysfunction, and denies personal history of goiter.  ) visit. Symptoms include diarrhea (intermittent) and heat intolerance. Patient reports no anxiety, cold intolerance, constipation, fatigue, hair loss, palpitations, tremors, weight gain or weight loss. The symptoms have been stable.    - She is currently on Levothyroxine 187 mcg p.o. daily before breakfast.     Review of systems  Constitutional: + Minimally fluctuating body weight,  current Body mass index is 40.33 kg/m. , no fatigue, + subjective hyperthermia, no subjective hypothermia Eyes: no blurry vision, no xerophthalmia ENT: no sore throat, no nodules palpated in throat, no dysphagia/odynophagia, no hoarseness Cardiovascular: no chest pain, no shortness of breath, no palpitations, no leg swelling Respiratory: no cough, no shortness of breath  Gastrointestinal: no nausea/vomiting, + intermittent diarrhea Musculoskeletal: no muscle/joint aches Skin: no rashes, no hyperemia Neurological: no tremors, no numbness, no tingling, no dizziness Psychiatric: no depression, no anxiety   Objective:    BP 124/85 (BP Location: Right Arm, Patient Position: Sitting, Cuff Size: Large)   Pulse 80   Ht 5\' 6"  (1.676 m)   Wt 249 lb 14.4 oz (113.4 kg)   BMI 40.33 kg/m   Wt Readings from Last 3 Encounters:  10/05/22 249 lb 14.4 oz (113.4 kg)  04/06/22 251 lb 6.4  oz (114 kg)  09/22/21 255 lb (115.7 kg)     BP Readings from Last 3 Encounters:  10/05/22 124/85  04/06/22 132/82  09/22/21 134/86    Physical Exam- Limited  Constitutional:  Body mass index is 40.33 kg/m. , not in acute distress, normal state of mind Eyes:  EOMI, no exophthalmos Musculoskeletal: no gross deformities, strength intact in all four extremities, no gross restriction of joint movements Skin:  no rashes, no hyperemia Neurological: no tremor with outstretched hands   Results for orders placed or performed in visit on 04/06/22  TSH  Result Value Ref Range   TSH 3.170 0.450 - 4.500 uIU/mL  T4, free  Result Value Ref Range   Free T4 1.28 0.82 - 1.77 ng/dL   Complete Blood Count (Most recent): Lab Results  Component Value Date   WBC 5.9 04/26/2016   HGB 13.4 04/26/2016   HCT 40.0 04/26/2016   MCV 79.4 (L) 04/26/2016   PLT 270 04/26/2016   Chemistry (most recent): Lab Results  Component Value Date   NA 140 04/20/2017   K 4.0 04/20/2017   CL 104 04/20/2017   CO2 26 04/20/2017   BUN 9 04/20/2017   CREATININE 0.71 04/20/2017     Lipid Panel     Component Value Date/Time   CHOL 176 04/20/2017 1211   TRIG 95 04/20/2017 1211   HDL 53 04/20/2017 1211   CHOLHDL 3.3 04/20/2017 1211   VLDL 17 04/26/2016 1400   LDLCALC 104 (H) 04/20/2017 1211   Recent Results (from the past 2160 hour(s))  TSH     Status: None   Collection Time: 10/01/22  3:39 PM  Result Value Ref Range   TSH 3.170 0.450 - 4.500 uIU/mL  T4, free     Status: None   Collection Time: 10/01/22  3:39 PM  Result Value Ref Range   Free T4 1.28 0.82 - 1.77 ng/dL     1) Thyroid uptake and scan from 06/29/2016 showed uniform uptake of 63.9% consistent with Graves' disease. 2) RAI therapy on 07/23/2016.    Latest Reference Range & Units 09/18/20 11:39 03/20/21 11:36 09/18/21 10:51 01/28/22 09:28 04/02/22 11:37 10/01/22 15:39  TSH 0.450 - 4.500 uIU/mL 4.270 4.330 9.470 (H) 7.740 (H) 2.240 3.170   T4,Free(Direct) 0.82 - 1.77 ng/dL 0.98 1.19 1.47 8.29 5.62 1.28  (H): Data is abnormally high  Assessment & Plan:   1. Hypothyroidism due to RAI therapy for Graves' disease  - She is status post RAI therapy for Graves' disease on 07/23/2016.   -Her previsit thyroid function tests are consistent with appropriate hormone replacement (says she missed a dose here and there).   She is advised to continue her Levothyroxine 187 mcg po daily before breakfast (takes a 50 mcg and a 137 mcg pill together).   - We discussed about the correct intake of her thyroid hormone, on empty stomach at fasting, with water, separated by at least 30 minutes  from breakfast and other medications,  and separated by more than 4 hours from calcium, iron, multivitamins, acid reflux medications (PPIs). -Patient is made aware of the fact that thyroid hormone replacement is needed for life, dose to be adjusted by periodic monitoring of thyroid function tests.   - I advised patient to maintain close follow up with her PMD for primary care needs.      I spent  18  minutes in the care of the patient today including review of labs from Thyroid Function, CMP, and other relevant labs ; imaging/biopsy records (current and previous including abstractions from other facilities); face-to-face time discussing  her lab results and symptoms, medications doses, her options of short and long term treatment based on the latest standards of care / guidelines;   and documenting the encounter.  Jamai Fest  participated in the discussions, expressed understanding, and voiced agreement with the above plans.  All questions were answered to her satisfaction. she is encouraged to contact clinic should she have any questions or concerns prior to her return visit.   Follow up plan: Return in about 1 year (around 10/05/2023) for Thyroid follow up, Previsit labs.   Ronny Bacon, [facility] [address] [place], [place] [address] Phone: [phone] Fax: [phone]  10/05/2022, 4:45 PM

## 2023-03-24 DIAGNOSIS — Z01419 Encounter for gynecological examination (general) (routine) without abnormal findings: Secondary | ICD-10-CM | POA: Diagnosis not present

## 2023-03-24 DIAGNOSIS — Z130 Encounter for screening for diseases of the blood and blood-forming organs and certain disorders involving the immune mechanism: Secondary | ICD-10-CM | POA: Diagnosis not present

## 2023-04-03 ENCOUNTER — Other Ambulatory Visit: Payer: Self-pay | Admitting: Nurse Practitioner

## 2023-07-06 ENCOUNTER — Other Ambulatory Visit: Payer: Self-pay | Admitting: Nurse Practitioner

## 2023-09-26 ENCOUNTER — Other Ambulatory Visit: Payer: Self-pay | Admitting: *Deleted

## 2023-09-26 ENCOUNTER — Telehealth: Payer: Self-pay | Admitting: Nurse Practitioner

## 2023-09-26 DIAGNOSIS — E890 Postprocedural hypothyroidism: Secondary | ICD-10-CM

## 2023-09-26 NOTE — Telephone Encounter (Signed)
 Labs updated

## 2023-09-26 NOTE — Telephone Encounter (Signed)
 Pt needs labs updated

## 2023-09-30 DIAGNOSIS — E890 Postprocedural hypothyroidism: Secondary | ICD-10-CM | POA: Diagnosis not present

## 2023-10-01 LAB — TSH: TSH: 2.15 u[IU]/mL (ref 0.450–4.500)

## 2023-10-01 LAB — T4, FREE: Free T4: 1.47 ng/dL (ref 0.82–1.77)

## 2023-10-04 NOTE — Patient Instructions (Signed)

## 2023-10-05 ENCOUNTER — Encounter: Payer: Self-pay | Admitting: Nurse Practitioner

## 2023-10-05 ENCOUNTER — Ambulatory Visit: Payer: BC Managed Care – PPO | Admitting: Nurse Practitioner

## 2023-10-05 VITALS — BP 126/84 | HR 90 | Ht 65.5 in | Wt 252.2 lb

## 2023-10-05 DIAGNOSIS — E890 Postprocedural hypothyroidism: Secondary | ICD-10-CM | POA: Diagnosis not present

## 2023-10-05 MED ORDER — LEVOTHYROXINE SODIUM 50 MCG PO TABS: 50.0000 ug | ORAL_TABLET | Freq: Every day | ORAL | 1 refills | Status: AC

## 2023-10-05 MED ORDER — LEVOTHYROXINE SODIUM 137 MCG PO TABS: 137.0000 ug | ORAL_TABLET | Freq: Every day | ORAL | 1 refills | Status: AC

## 2023-10-05 NOTE — Progress Notes (Signed)
 10/05/2023           Endocrinology follow-up note    Subjective:    Patient ID: Kelly [name], female    DOB: [birth year], PCP Patient, No Pcp Per.   Past Medical History:  Diagnosis Date   Abrasion of right knee    patient had an abrasion of right knee on arrival to endoscopy   GERD (gastroesophageal reflux disease)    Graves disease    Hypertension    Hyperthyroidism 06/24/2016   Situational depression / anxiety 04/26/2016   Past Surgical History:  Procedure Laterality Date   ANTERIOR CRUCIATE LIGAMENT REPAIR  [id number]   BREAST SURGERY  2008   reconstruction   COLONOSCOPY N/A 06/04/2016   Procedure: COLONOSCOPY;  Surgeon: Margo LITTIE Haddock, MD;  Location: [facility];  Service: Endoscopy;  Laterality: N/A;  115   Social History   Socioeconomic History   Marital status: Single    Spouse name: Not on file   Number of children: 0   Years of education: 16   Highest education level: Not on file  Occupational History   Occupation: admin  Tobacco Use   Smoking status: Never   Smokeless tobacco: Never  Vaping Use   Vaping status: Never Used  Substance and Sexual Activity   Alcohol use: No   Drug use: No   Sexual activity: Not Currently  Other Topics Concern   Not on file  Social History Narrative   Graduated [employer] with degree political science      Works as Corporate treasurer asst      Lives with parents      2 dogs      Social Drivers of Corporate investment banker Strain: Not on file  Food Insecurity: Not on file  Transportation Needs: Not on file  Physical Activity: Not on file  Stress: Not on file  Social Connections: Not on file   Outpatient Encounter Medications as of 10/05/2023  Medication Sig   Cholecalciferol (VITAMIN D3) 125 MCG (5000 UT) CAPS Take 1 capsule by mouth daily.   loratadine (CLARITIN) 10 MG tablet Take 10 mg by mouth daily as needed for allergies.   norethindrone-ethinyl estradiol (MICROGESTIN,JUNEL,LOESTRIN) 1-20 MG-MCG tablet Take 1 tablet by  mouth daily.   [DISCONTINUED] levothyroxine  (SYNTHROID ) 137 MCG tablet TAKE 1 TABLET BY MOUTH ONCE DAILY BEFORE BREAKFAST   [DISCONTINUED] levothyroxine  (SYNTHROID ) 50 MCG tablet Take 1 tablet by mouth once daily   levothyroxine  (SYNTHROID ) 137 MCG tablet Take 1 tablet (137 mcg total) by mouth daily before breakfast.   levothyroxine  (SYNTHROID ) 50 MCG tablet Take 1 tablet (50 mcg total) by mouth daily before breakfast.   No facility-administered encounter medications on file as of 10/05/2023.   ALLERGIES: No Known Allergies VACCINATION STATUS: Immunization History  Administered Date(s) Administered   Influenza-Unspecified 12/20/2016    HPI Thyroid  Problem Presents for follow-up (She is status post RAI therapy on 07/23/2016, for Graves' disease. The patient denies family history of thyroid  dysfunction, and denies personal history of goiter.  ) visit. Patient reports no anxiety, cold intolerance, constipation, diarrhea (intermittent), fatigue, hair loss, heat intolerance, palpitations, tremors, weight gain or weight loss. The symptoms have been stable.    - She is currently on Levothyroxine  187 mcg p.o. daily before breakfast.     Review of systems  Constitutional: +decreasing body weight,  current Body mass index is 41.33 kg/m. , no fatigue, no subjective hyperthermia, no subjective hypothermia Eyes: no blurry vision, no xerophthalmia ENT: no sore throat,  no nodules palpated in throat, no dysphagia/odynophagia, no hoarseness Cardiovascular: no chest pain, no shortness of breath, no palpitations, no leg swelling Respiratory: no cough, no shortness of breath Gastrointestinal: no nausea/vomiting/diarrhea Musculoskeletal: no muscle/joint aches Skin: no rashes, no hyperemia Neurological: no tremors, no numbness, no tingling, no dizziness Psychiatric: no depression, no anxiety, + increased stress from work   Objective:    BP 126/84 (BP Location: Right Arm, Patient Position: Sitting,  Cuff Size: Large)   Pulse 90   Ht 5' 5.5 (1.664 m)   Wt 252 lb 3.2 oz (114.4 kg)   BMI 41.33 kg/m   Wt Readings from Last 3 Encounters:  10/05/23 252 lb 3.2 oz (114.4 kg)  10/05/22 249 lb 14.4 oz (113.4 kg)  04/06/22 251 lb 6.4 oz (114 kg)     BP Readings from Last 3 Encounters:  10/05/23 126/84  10/05/22 124/85  04/06/22 132/82     Physical Exam- Limited  Constitutional:  Body mass index is 41.33 kg/m. , not in acute distress, normal state of mind Eyes:  EOMI, no exophthalmos Musculoskeletal: no gross deformities, strength intact in all four extremities, no gross restriction of joint movements Skin:  no rashes, no hyperemia Neurological: no tremor with outstretched hands   Results for orders placed or performed in visit on 09/26/23  T4, Free   Collection Time: 09/30/23  1:03 PM  Result Value Ref Range   Free T4 1.47 0.82 - 1.77 ng/dL  TSH   Collection Time: 09/30/23  1:03 PM  Result Value Ref Range   TSH 2.150 0.450 - 4.500 uIU/mL   Complete Blood Count (Most recent): Lab Results  Component Value Date   WBC 5.9 04/26/2016   HGB 13.4 04/26/2016   HCT 40.0 04/26/2016   MCV 79.4 (L) 04/26/2016   PLT 270 04/26/2016   Chemistry (most recent): Lab Results  Component Value Date   NA 140 04/20/2017   K 4.0 04/20/2017   CL 104 04/20/2017   CO2 26 04/20/2017   BUN 9 04/20/2017   CREATININE 0.71 04/20/2017     Lipid Panel     Component Value Date/Time   CHOL 176 04/20/2017 1211   TRIG 95 04/20/2017 1211   HDL 53 04/20/2017 1211   CHOLHDL 3.3 04/20/2017 1211   VLDL 17 04/26/2016 1400   LDLCALC 104 (H) 04/20/2017 1211   Recent Results (from the past 2160 hours)  T4, Free     Status: None   Collection Time: 09/30/23  1:03 PM  Result Value Ref Range   Free T4 1.47 0.82 - 1.77 ng/dL  TSH     Status: None   Collection Time: 09/30/23  1:03 PM  Result Value Ref Range   TSH 2.150 0.450 - 4.500 uIU/mL     1) Thyroid  uptake and scan from 06/29/2016 showed  uniform uptake of 63.9% consistent with Graves' disease. 2) RAI therapy on 07/23/2016.    Latest Reference Range & Units 03/20/21 11:36 09/18/21 10:51 01/28/22 09:28 04/02/22 11:37 10/01/22 15:39 09/30/23 13:03  TSH 0.450 - 4.500 uIU/mL 4.330 9.470 (H) 7.740 (H) 2.240 3.170 2.150  T4,Free(Direct) 0.82 - 1.77 ng/dL 8.26 8.77 8.69 8.46 8.71 1.47  (H): Data is abnormally high  Assessment & Plan:   1. Hypothyroidism due to RAI therapy for Graves' disease  - She is status post RAI therapy for Graves' disease on 07/23/2016.   -Her previsit thyroid  function tests are consistent with appropriate hormone replacement.   She is advised to continue her Levothyroxine   187 mcg po daily before breakfast (takes a 50 mcg and a 137 mcg pill together).   - We discussed about the correct intake of her thyroid  hormone, on empty stomach at fasting, with water , separated by at least 30 minutes from breakfast and other medications,  and separated by more than 4 hours from calcium, iron, multivitamins, acid reflux medications (PPIs). -Patient is made aware of the fact that thyroid  hormone replacement is needed for life, dose to be adjusted by periodic monitoring of thyroid  function tests.   - I advised patient to maintain close follow up with her PMD for primary care needs.     I spent  16  minutes in the care of the patient today including review of labs from Thyroid  Function, CMP, and other relevant labs ; imaging/biopsy records (current and previous including abstractions from other facilities); face-to-face time discussing  her lab results and symptoms, medications doses, her options of short and long term treatment based on the latest standards of care / guidelines;   and documenting the encounter.  Lizzy Hamre  participated in the discussions, expressed understanding, and voiced agreement with the above plans.  All questions were answered to her satisfaction. she is encouraged to contact clinic should she  have any questions or concerns prior to her return visit.   Follow up plan: Return in about 1 year (around 10/04/2024) for Thyroid  follow up, Previsit labs.   Benton Rio, [facility] [address] [place], [place] [address] Phone: [phone] Fax: [phone]  10/05/2023, 4:05 PM

## 2024-10-04 ENCOUNTER — Ambulatory Visit: Admitting: Nurse Practitioner
